# Patient Record
Sex: Female | Born: 1935 | Hispanic: Yes | Smoking: Never smoker
Health system: Southern US, Community
[De-identification: ages and names within clinical notes are randomized; demographics above are authoritative.]

## PROBLEM LIST (undated history)

## (undated) DIAGNOSIS — I219 Acute myocardial infarction, unspecified: Secondary | ICD-10-CM

## (undated) DIAGNOSIS — I10 Essential (primary) hypertension: Secondary | ICD-10-CM

## (undated) DIAGNOSIS — I2510 Atherosclerotic heart disease of native coronary artery without angina pectoris: Secondary | ICD-10-CM

## (undated) DIAGNOSIS — A159 Respiratory tuberculosis unspecified: Secondary | ICD-10-CM

## (undated) HISTORY — PX: ABDOMINAL HYSTERECTOMY: SHX81

---

## 2018-02-02 ENCOUNTER — Encounter: Payer: Self-pay | Admitting: *Deleted

## 2018-02-03 ENCOUNTER — Encounter: Payer: Self-pay | Admitting: *Deleted

## 2018-02-03 ENCOUNTER — Other Ambulatory Visit: Payer: Self-pay

## 2018-02-03 ENCOUNTER — Encounter
Admission: RE | Disposition: A | Discharge status: Home / Self Care | Payer: Self-pay | Source: Ambulatory Visit | Attending: Ophthalmology

## 2018-02-03 ENCOUNTER — Ambulatory Visit
Admission: RE | Admit: 2018-02-03 | Discharge: 2018-02-03 | Disposition: A | Discharge status: Home / Self Care | Payer: Medicaid Other | Source: Ambulatory Visit | Attending: Ophthalmology | Admitting: Ophthalmology

## 2018-02-03 ENCOUNTER — Ambulatory Visit: Payer: Medicaid Other | Admitting: Anesthesiology

## 2018-02-03 DIAGNOSIS — I10 Essential (primary) hypertension: Secondary | ICD-10-CM | POA: Diagnosis not present

## 2018-02-03 DIAGNOSIS — Z90710 Acquired absence of both cervix and uterus: Secondary | ICD-10-CM | POA: Insufficient documentation

## 2018-02-03 DIAGNOSIS — Z91013 Allergy to seafood: Secondary | ICD-10-CM | POA: Diagnosis not present

## 2018-02-03 DIAGNOSIS — H2512 Age-related nuclear cataract, left eye: Secondary | ICD-10-CM | POA: Insufficient documentation

## 2018-02-03 DIAGNOSIS — Z87891 Personal history of nicotine dependence: Secondary | ICD-10-CM | POA: Insufficient documentation

## 2018-02-03 DIAGNOSIS — Z8615 Personal history of latent tuberculosis infection: Secondary | ICD-10-CM | POA: Insufficient documentation

## 2018-02-03 DIAGNOSIS — I2510 Atherosclerotic heart disease of native coronary artery without angina pectoris: Secondary | ICD-10-CM | POA: Diagnosis not present

## 2018-02-03 DIAGNOSIS — I252 Old myocardial infarction: Secondary | ICD-10-CM | POA: Insufficient documentation

## 2018-02-03 HISTORY — DX: Respiratory tuberculosis unspecified: A15.9

## 2018-02-03 HISTORY — DX: Acute myocardial infarction, unspecified: I21.9

## 2018-02-03 HISTORY — DX: Essential (primary) hypertension: I10

## 2018-02-03 HISTORY — DX: Atherosclerotic heart disease of native coronary artery without angina pectoris: I25.10

## 2018-02-03 HISTORY — PX: CATARACT EXTRACTION W/PHACO: SHX586

## 2018-02-03 SURGERY — PHACOEMULSIFICATION, CATARACT, WITH IOL INSERTION
Anesthesia: Monitor Anesthesia Care | Site: Eye | Laterality: Left

## 2018-02-03 MED ORDER — CARBACHOL 0.01 % IO SOLN
INTRAOCULAR | Status: DC | PRN
  Administered 2018-02-03: .5 mL via INTRAOCULAR

## 2018-02-03 MED ORDER — EPINEPHRINE PF 1 MG/ML IJ SOLN
INTRAOCULAR | Status: DC | PRN
  Administered 2018-02-03: 1 mL via OPHTHALMIC

## 2018-02-03 MED ORDER — ARMC OPHTHALMIC DILATING DROPS
OPHTHALMIC | Status: AC
  Filled 2018-02-03: qty 0.5

## 2018-02-03 MED ORDER — NA CHONDROIT SULF-NA HYALURON 40-17 MG/ML IO SOLN
INTRAOCULAR | Status: DC | PRN
  Administered 2018-02-03: 1 mL via INTRAOCULAR

## 2018-02-03 MED ORDER — MOXIFLOXACIN HCL 0.5 % OP SOLN
OPHTHALMIC | Status: DC | PRN
  Administered 2018-02-03: .2 mL via OPHTHALMIC

## 2018-02-03 MED ORDER — MOXIFLOXACIN HCL 0.5 % OP SOLN
OPHTHALMIC | Status: AC
  Filled 2018-02-03: qty 3

## 2018-02-03 MED ORDER — SODIUM CHLORIDE 0.9 % IV SOLN
INTRAVENOUS | Status: DC
  Administered 2018-02-03: 11:00:00 via INTRAVENOUS

## 2018-02-03 MED ORDER — MOXIFLOXACIN HCL 0.5 % OP SOLN: 1.0000 [drp] | OPHTHALMIC | Status: DC | PRN

## 2018-02-03 MED ORDER — TETRACAINE HCL 0.5 % OP SOLN
OPHTHALMIC | Status: AC
  Filled 2018-02-03: qty 4

## 2018-02-03 MED ORDER — TETRACAINE HCL 0.5 % OP SOLN
1.0000 [drp] | OPHTHALMIC | Status: AC | PRN
  Administered 2018-02-03 (×3): 1 [drp] via OPHTHALMIC

## 2018-02-03 MED ORDER — LIDOCAINE HCL (PF) 4 % IJ SOLN
INTRAOCULAR | Status: DC | PRN
  Administered 2018-02-03: 2 mL via OPHTHALMIC

## 2018-02-03 MED ORDER — MIDAZOLAM HCL 2 MG/2ML IJ SOLN
INTRAMUSCULAR | Status: AC
  Filled 2018-02-03: qty 2

## 2018-02-03 MED ORDER — MIDAZOLAM HCL 2 MG/2ML IJ SOLN
INTRAMUSCULAR | Status: DC | PRN
  Administered 2018-02-03: 1 mg via INTRAVENOUS

## 2018-02-03 MED ORDER — POVIDONE-IODINE 5 % OP SOLN
OPHTHALMIC | Status: DC | PRN
  Administered 2018-02-03: 1 via OPHTHALMIC

## 2018-02-03 MED ORDER — ARMC OPHTHALMIC DILATING DROPS
1.0000 "application " | OPHTHALMIC | Status: AC
  Administered 2018-02-03 (×3): 1 via OPHTHALMIC

## 2018-02-03 SURGICAL SUPPLY — 16 items
GLOVE BIO SURGEON STRL SZ8 (GLOVE) ×3 IMPLANT
GLOVE BIOGEL M 6.5 STRL (GLOVE) ×3 IMPLANT
GLOVE SURG LX 8.0 MICRO (GLOVE) ×2
GLOVE SURG LX STRL 8.0 MICRO (GLOVE) ×1 IMPLANT
GOWN STRL REUS W/ TWL LRG LVL3 (GOWN DISPOSABLE) ×2 IMPLANT
GOWN STRL REUS W/TWL LRG LVL3 (GOWN DISPOSABLE) ×4
LABEL CATARACT MEDS ST (LABEL) ×3 IMPLANT
LENS IOL TECNIS ITEC 27.0 (Intraocular Lens) ×2 IMPLANT
PACK CATARACT (MISCELLANEOUS) ×3 IMPLANT
PACK CATARACT BRASINGTON LX (MISCELLANEOUS) ×3 IMPLANT
PACK EYE AFTER SURG (MISCELLANEOUS) ×3 IMPLANT
SOL BSS BAG (MISCELLANEOUS) ×3
SOLUTION BSS BAG (MISCELLANEOUS) ×1 IMPLANT
SYR 5ML LL (SYRINGE) ×3 IMPLANT
WATER STERILE IRR 250ML POUR (IV SOLUTION) ×3 IMPLANT
WIPE NON LINTING 3.25X3.25 (MISCELLANEOUS) ×3 IMPLANT

## 2018-02-03 NOTE — Transfer of Care (Signed)
Immediate Anesthesia Transfer of Care Note  Patient: Connie Burns  Procedure(s) Performed: CATARACT EXTRACTION PHACO AND INTRAOCULAR LENS PLACEMENT ([facility]) (Left Eye)  Patient Location: PACU and Short Stay  Anesthesia Type:MAC  Level of Consciousness: awake  Airway & Oxygen Therapy: Patient Spontanous Breathing  Post-op Assessment: Report given to RN  Post vital signs: stable  Last Vitals:  Vitals Value Taken Time  BP    Temp    Pulse    Resp    SpO2      Last Pain:  Vitals:   02/03/18 1033  TempSrc: Temporal  PainSc: 0-No pain         Complications: No apparent anesthesia complications

## 2018-02-03 NOTE — Anesthesia Postprocedure Evaluation (Signed)
Anesthesia Post Note  Patient: Connie Burns  Procedure(s) Performed: CATARACT EXTRACTION PHACO AND INTRAOCULAR LENS PLACEMENT ([place]) (Left Eye)  Patient location during evaluation: Short Stay Anesthesia Type: MAC Level of consciousness: awake and alert Pain management: pain level controlled Vital Signs Assessment: post-procedure vital signs reviewed and stable Respiratory status: spontaneous breathing and respiratory function stable Cardiovascular status: stable Postop Assessment: no apparent nausea or vomiting Anesthetic complications: no     Last Vitals:  Vitals:   02/03/18 1033  BP: (!) 144/75  Pulse: 90  Resp: 16  Temp: (!) 35.9 C  SpO2: 99%    Last Pain:  Vitals:   02/03/18 1033  TempSrc: Temporal  PainSc: 0-No pain                 Kera Deacon E Jillisa Harris

## 2018-02-03 NOTE — Anesthesia Preprocedure Evaluation (Signed)
Anesthesia Evaluation  Patient identified by MRN, date of birth, ID band Patient awake    Reviewed: Allergy & Precautions, NPO status , Patient's Chart, lab work & pertinent test results  History of Anesthesia Complications Negative for: history of anesthetic complications  Airway Mallampati: II  TM Distance: >3 FB Neck ROM: Full    Dental no notable dental hx.    Pulmonary neg sleep apnea, neg COPD, former smoker,    breath sounds clear to auscultation- rhonchi (-) wheezing      Cardiovascular Exercise Tolerance: Good hypertension, + CAD and + Past MI  (-) Cardiac Stents and (-) CABG  Rhythm:Regular Rate:Normal - Systolic murmurs and - Diastolic murmurs    Neuro/Psych negative neurological ROS  negative psych ROS   GI/Hepatic negative GI ROS, Neg liver ROS,   Endo/Other  negative endocrine ROSneg diabetes  Renal/GU negative Renal ROS     Musculoskeletal negative musculoskeletal ROS (+)   Abdominal (+) - obese,   Peds  Hematology negative hematology ROS (+)   Anesthesia Other Findings Past Medical History: No date: Coronary artery disease No date: Hypertension No date: Myocardial infarction [facility])     Comment:  2016 No date: Tuberculosis     Comment:  LATENT   TX 6 TO 8 MONTHS AGO   Reproductive/Obstetrics                             Anesthesia Physical Anesthesia Plan  ASA: II  Anesthesia Plan: MAC   Post-op Pain Management:    Induction: Intravenous  PONV Risk Score and Plan: 2 and Midazolam  Airway Management Planned: Natural Airway  Additional Equipment:   Intra-op Plan:   Post-operative Plan:   Informed Consent: I have reviewed the patients History and Physical, chart, labs and discussed the procedure including the risks, benefits and alternatives for the proposed anesthesia with the patient or authorized representative who has indicated his/her understanding and  acceptance.     Plan Discussed with: CRNA and Anesthesiologist  Anesthesia Plan Comments:         Anesthesia Quick Evaluation

## 2018-02-03 NOTE — Discharge Instructions (Signed)
Eye Surgery Discharge Instructions    Expect mild scratchy sensation or mild soreness. DO NOT RUB YOUR EYE!  The day of surgery:  Minimal physical activity, but bed rest is not required  No reading, computer work, or close hand work  No bending, lifting, or straining.  May watch TV  For 24 hours:  No driving, legal decisions, or alcoholic beverages  Safety precautions  Eat anything you prefer: It is better to start with liquids, then soup then solid foods.  _____ Eye patch should be worn until postoperative exam tomorrow.  ____ Solar shield eyeglasses should be worn for comfort in the sunlight/patch while sleeping  Resume all regular medications including aspirin or Coumadin if these were discontinued prior to surgery. You may shower, bathe, shave, or wash your hair. Tylenol may be taken for mild discomfort.  Call your doctor if you experience significant pain, nausea, or vomiting, fever > 101 or other signs of infection. [phone] or [phone] Specific instructions:  Follow-up Information    Galen Manila, MD Follow up.   Specialty:  Ophthalmology Why:  October 9 at 10:45am Contact information: [address] [place] [address] [phone]

## 2018-02-03 NOTE — Anesthesia Post-op Follow-up Note (Signed)
Anesthesia QCDR form completed.        

## 2018-02-03 NOTE — Op Note (Signed)
PREOPERATIVE DIAGNOSIS:  Nuclear sclerotic cataract of the left eye.   POSTOPERATIVE DIAGNOSIS:  Nuclear sclerotic cataract of the left eye.   OPERATIVE PROCEDURE: Procedure(s): CATARACT EXTRACTION PHACO AND INTRAOCULAR LENS PLACEMENT ([facility])   SURGEON:  Galen Manila, MD.   ANESTHESIA:  Anesthesiologist: Alver Fisher, MD CRNA: Oliva Bustard, CRNA  1.      Managed anesthesia care. 2.     0.31ml of Shugarcaine was instilled following the paracentesis   COMPLICATIONS:  None.   TECHNIQUE:   Stop and chop   DESCRIPTION OF PROCEDURE:  The patient was examined and consented in the preoperative holding area where the aforementioned topical anesthesia was applied to the left eye and then brought back to the Operating Room where the left eye was prepped and draped in the usual sterile ophthalmic fashion and a lid speculum was placed. A paracentesis was created with the side port blade and the anterior chamber was filled with viscoelastic. A near clear corneal incision was performed with the steel keratome. A continuous curvilinear capsulorrhexis was performed with a cystotome followed by the capsulorrhexis forceps. Hydrodissection and hydrodelineation were carried out with BSS on a blunt cannula. The lens was removed in a stop and chop  technique and the remaining cortical material was removed with the irrigation-aspiration handpiece. The capsular bag was inflated with viscoelastic and the Technis [id number] lens was placed in the capsular bag without complication. The remaining viscoelastic was removed from the eye with the irrigation-aspiration handpiece. The wounds were hydrated. The anterior chamber was flushed with Miostat and the eye was inflated to physiologic pressure. 0.10ml Vigamox was placed in the anterior chamber. The wounds were found to be water tight. The eye was dressed with Vigamox. The patient was given protective glasses to wear throughout the day and a shield with which to sleep  tonight. The patient was also given drops with which to begin a drop regimen today and will follow-up with me in one day. Implant Name Type Inv. Item Serial No. Manufacturer Lot No. LRB No. Used  LENS IOL DIOP 27.0 - [id number] Intraocular Lens LENS IOL DIOP 27.0 [id number]  Left 1    Procedure(s) with comments: CATARACT EXTRACTION PHACO AND INTRAOCULAR LENS PLACEMENT ([facility]) (Left) - [place] 00:36 CDE Fluid pack lot #[id number]  Electronically signed: Galen Manila 02/03/2018 12:10 PM

## 2018-02-03 NOTE — H&P (Signed)
All labs reviewed. Abnormal studies sent to patients PCP when indicated.  Previous H&P reviewed, patient examined, there are NO CHANGES.  Connie Wenke Porfilio10/8/201911:42 AM

## 2018-02-04 ENCOUNTER — Encounter: Payer: Self-pay | Admitting: Ophthalmology

## 2018-03-02 ENCOUNTER — Encounter: Payer: Self-pay | Admitting: *Deleted

## 2018-03-03 ENCOUNTER — Ambulatory Visit: Payer: Medicaid Other | Admitting: Anesthesiology

## 2018-03-03 ENCOUNTER — Ambulatory Visit
Admission: RE | Admit: 2018-03-03 | Discharge: 2018-03-03 | Disposition: A | Discharge status: Home / Self Care | Payer: Medicaid Other | Source: Ambulatory Visit | Attending: Ophthalmology | Admitting: Ophthalmology

## 2018-03-03 ENCOUNTER — Other Ambulatory Visit: Payer: Self-pay

## 2018-03-03 ENCOUNTER — Encounter
Admission: RE | Disposition: A | Discharge status: Home / Self Care | Payer: Self-pay | Source: Ambulatory Visit | Attending: Ophthalmology

## 2018-03-03 DIAGNOSIS — Z7982 Long term (current) use of aspirin: Secondary | ICD-10-CM | POA: Diagnosis not present

## 2018-03-03 DIAGNOSIS — I252 Old myocardial infarction: Secondary | ICD-10-CM | POA: Diagnosis not present

## 2018-03-03 DIAGNOSIS — Z79899 Other long term (current) drug therapy: Secondary | ICD-10-CM | POA: Diagnosis not present

## 2018-03-03 DIAGNOSIS — H2511 Age-related nuclear cataract, right eye: Secondary | ICD-10-CM | POA: Insufficient documentation

## 2018-03-03 DIAGNOSIS — I10 Essential (primary) hypertension: Secondary | ICD-10-CM | POA: Diagnosis not present

## 2018-03-03 HISTORY — PX: CATARACT EXTRACTION W/PHACO: SHX586

## 2018-03-03 SURGERY — PHACOEMULSIFICATION, CATARACT, WITH IOL INSERTION
Anesthesia: Monitor Anesthesia Care | Site: Eye | Laterality: Right

## 2018-03-03 MED ORDER — LIDOCAINE HCL (PF) 4 % IJ SOLN
INTRAOCULAR | Status: DC | PRN
  Administered 2018-03-03: 2 mL via OPHTHALMIC

## 2018-03-03 MED ORDER — DIPHENHYDRAMINE HCL 50 MG/ML IJ SOLN
INTRAMUSCULAR | Status: DC | PRN
  Administered 2018-03-03: 12.5 mg via INTRAVENOUS

## 2018-03-03 MED ORDER — TETRACAINE HCL 0.5 % OP SOLN
1.0000 [drp] | OPHTHALMIC | Status: DC | PRN
  Administered 2018-03-03 (×3): 1 [drp] via OPHTHALMIC

## 2018-03-03 MED ORDER — MIDAZOLAM HCL 2 MG/2ML IJ SOLN
INTRAMUSCULAR | Status: DC | PRN
  Administered 2018-03-03: 1 mg via INTRAVENOUS

## 2018-03-03 MED ORDER — CARBACHOL 0.01 % IO SOLN
INTRAOCULAR | Status: DC | PRN
  Administered 2018-03-03: .5 mL via INTRAOCULAR

## 2018-03-03 MED ORDER — ARMC OPHTHALMIC DILATING DROPS
1.0000 "application " | OPHTHALMIC | Status: AC
  Administered 2018-03-03 (×3): 1 via OPHTHALMIC

## 2018-03-03 MED ORDER — TETRACAINE HCL 0.5 % OP SOLN
OPHTHALMIC | Status: AC
  Administered 2018-03-03: 1 [drp] via OPHTHALMIC
  Filled 2018-03-03: qty 4

## 2018-03-03 MED ORDER — SODIUM CHLORIDE 0.9 % IV SOLN
INTRAVENOUS | Status: DC
  Administered 2018-03-03: 09:00:00 via INTRAVENOUS

## 2018-03-03 MED ORDER — DIPHENHYDRAMINE HCL 50 MG/ML IJ SOLN
INTRAMUSCULAR | Status: AC
  Filled 2018-03-03: qty 1

## 2018-03-03 MED ORDER — NA CHONDROIT SULF-NA HYALURON 40-17 MG/ML IO SOLN
INTRAOCULAR | Status: DC | PRN
  Administered 2018-03-03: 1 mL via INTRAOCULAR

## 2018-03-03 MED ORDER — MIDAZOLAM HCL 2 MG/2ML IJ SOLN
INTRAMUSCULAR | Status: AC
  Filled 2018-03-03: qty 2

## 2018-03-03 MED ORDER — ARMC OPHTHALMIC DILATING DROPS
OPHTHALMIC | Status: AC
  Administered 2018-03-03: 1 via OPHTHALMIC
  Filled 2018-03-03: qty 0.5

## 2018-03-03 MED ORDER — POVIDONE-IODINE 5 % OP SOLN
OPHTHALMIC | Status: DC | PRN
  Administered 2018-03-03: 1 via OPHTHALMIC

## 2018-03-03 MED ORDER — EPINEPHRINE PF 1 MG/ML IJ SOLN
INTRAOCULAR | Status: DC | PRN
  Administered 2018-03-03: 1 mL via OPHTHALMIC

## 2018-03-03 MED ORDER — MOXIFLOXACIN HCL 0.5 % OP SOLN: 1.0000 [drp] | OPHTHALMIC | Status: DC | PRN

## 2018-03-03 MED ORDER — MOXIFLOXACIN HCL 0.5 % OP SOLN
OPHTHALMIC | Status: DC | PRN
  Administered 2018-03-03: .2 mL via OPHTHALMIC

## 2018-03-03 MED ORDER — MOXIFLOXACIN HCL 0.5 % OP SOLN
OPHTHALMIC | Status: AC
  Filled 2018-03-03: qty 3

## 2018-03-03 SURGICAL SUPPLY — 16 items
GLOVE BIO SURGEON STRL SZ8 (GLOVE) ×3 IMPLANT
GLOVE BIOGEL M 6.5 STRL (GLOVE) ×3 IMPLANT
GLOVE SURG LX 8.0 MICRO (GLOVE) ×2
GLOVE SURG LX STRL 8.0 MICRO (GLOVE) ×1 IMPLANT
GOWN STRL REUS W/ TWL LRG LVL3 (GOWN DISPOSABLE) ×2 IMPLANT
GOWN STRL REUS W/TWL LRG LVL3 (GOWN DISPOSABLE) ×4
LABEL CATARACT MEDS ST (LABEL) ×3 IMPLANT
LENS IOL TECNIS ITEC 27.0 (Intraocular Lens) ×2 IMPLANT
PACK CATARACT (MISCELLANEOUS) ×3 IMPLANT
PACK CATARACT BRASINGTON LX (MISCELLANEOUS) ×3 IMPLANT
PACK EYE AFTER SURG (MISCELLANEOUS) ×3 IMPLANT
SOL BSS BAG (MISCELLANEOUS) ×3
SOLUTION BSS BAG (MISCELLANEOUS) ×1 IMPLANT
SYR 5ML LL (SYRINGE) ×3 IMPLANT
WATER STERILE IRR 250ML POUR (IV SOLUTION) ×3 IMPLANT
WIPE NON LINTING 3.25X3.25 (MISCELLANEOUS) ×3 IMPLANT

## 2018-03-03 NOTE — Anesthesia Post-op Follow-up Note (Signed)
Anesthesia QCDR form completed.        

## 2018-03-03 NOTE — Anesthesia Preprocedure Evaluation (Signed)
Anesthesia Evaluation  Patient identified by MRN, date of birth, ID band Patient awake    Reviewed: Allergy & Precautions, NPO status , Patient's Chart, lab work & pertinent test results  History of Anesthesia Complications Negative for: history of anesthetic complications  Airway Mallampati: II  TM Distance: >3 FB Neck ROM: Full    Dental no notable dental hx.    Pulmonary neg sleep apnea, neg COPD, former smoker,    breath sounds clear to auscultation- rhonchi (-) wheezing      Cardiovascular Exercise Tolerance: Good hypertension, + CAD and + Past MI  (-) Cardiac Stents and (-) CABG  Rhythm:Regular Rate:Normal - Systolic murmurs and - Diastolic murmurs    Neuro/Psych negative neurological ROS  negative psych ROS   GI/Hepatic negative GI ROS, Neg liver ROS,   Endo/Other  negative endocrine ROSneg diabetes  Renal/GU negative Renal ROS     Musculoskeletal negative musculoskeletal ROS (+)   Abdominal (+) - obese,   Peds  Hematology negative hematology ROS (+)   Anesthesia Other Findings Past Medical History: No date: Coronary artery disease No date: Hypertension No date: Myocardial infarction ([facility])     Comment:  2016 No date: Tuberculosis     Comment:  LATENT   TX 6 TO 8 MONTHS AGO   Reproductive/Obstetrics                             Anesthesia Physical Anesthesia Plan  ASA: II  Anesthesia Plan: MAC   Post-op Pain Management:    Induction: Intravenous  PONV Risk Score and Plan: 2 and Midazolam  Airway Management Planned: Natural Airway  Additional Equipment:   Intra-op Plan:   Post-operative Plan:   Informed Consent: I have reviewed the patients History and Physical, chart, labs and discussed the procedure including the risks, benefits and alternatives for the proposed anesthesia with the patient or authorized representative who has indicated his/her understanding and  acceptance.     Plan Discussed with: CRNA and Anesthesiologist  Anesthesia Plan Comments:         Anesthesia Quick Evaluation  

## 2018-03-03 NOTE — H&P (Signed)
All labs reviewed. Abnormal studies sent to patients PCP when indicated.  Previous H&P reviewed, patient examined, there are NO CHANGES.  Connie Pink Porfilio11/5/20199:08 AM

## 2018-03-03 NOTE — Transfer of Care (Signed)
Immediate Anesthesia Transfer of Care Note  Patient: Connie Burns  Procedure(s) Performed: CATARACT EXTRACTION PHACO AND INTRAOCULAR LENS PLACEMENT ([place]) (Right Eye)  Patient Location: Short Stay  Anesthesia Type:MAC  Level of Consciousness: awake, alert  and oriented  Airway & Oxygen Therapy: Patient Spontanous Breathing  Post-op Assessment: Report given to RN and Post -op Vital signs reviewed and stable  Post vital signs: Reviewed and stable  Last Vitals:  Vitals Value Taken Time  BP 146/67 03/03/2018  9:37 AM  Temp 36.6 C 03/03/2018  9:37 AM  Pulse 82 03/03/2018  9:37 AM  Resp 16 03/03/2018  9:37 AM  SpO2 100 % 03/03/2018  9:37 AM    Last Pain:  Vitals:   03/03/18 0937  TempSrc: Temporal  PainSc: 7          Complications: No apparent anesthesia complications

## 2018-03-03 NOTE — Discharge Instructions (Addendum)
°  Eye Surgery Discharge Instructions  Expect mild scratchy sensation or mild soreness. DO NOT RUB YOUR EYE!  The day of surgery:  Minimal physical activity, but bed rest is not required  No reading, computer work, or close hand work  No bending, lifting, or straining.  May watch TV  For 24 hours:  No driving, legal decisions, or alcoholic beverages  Safety precautions  Eat anything you prefer: It is better to start with liquids, then soup then solid foods.  Solar shield eyeglasses should be worn for comfort in the sunlight/patch while sleeping  Resume all regular medications including aspirin or Coumadin if these were discontinued prior to surgery. You may shower, bathe, shave, or wash your hair. Tylenol may be taken for mild discomfort. Follow Dr. Gerome Sam eye drop instruction sheet as reviewed.  Call your doctor if you experience significant pain, nausea, or vomiting, fever > 101 or other signs of infection. [phone] or [phone] Specific instructions:  Follow-up Information    Galen Manila, MD Follow up.   Specialty:  Ophthalmology Why:  111/6/19 @ 9:40 am Contact information: [address] [place] [address] [phone]

## 2018-03-03 NOTE — Anesthesia Postprocedure Evaluation (Signed)
Anesthesia Post Note  Patient: Connie Burns  Procedure(s) Performed: CATARACT EXTRACTION PHACO AND INTRAOCULAR LENS PLACEMENT ([place]) (Right Eye)  Patient location during evaluation: Short Stay Anesthesia Type: MAC Level of consciousness: awake, awake and alert and oriented Pain management: pain level controlled Vital Signs Assessment: post-procedure vital signs reviewed and stable Respiratory status: spontaneous breathing Cardiovascular status: stable Postop Assessment: no headache and adequate PO intake Anesthetic complications: no     Last Vitals:  Vitals:   03/03/18 0833 03/03/18 0937  BP: (!) 157/91 (!) 146/67  Pulse: 97 82  Resp: 16 16  Temp: 36.5 C 36.6 C  SpO2: 100% 100%    Last Pain:  Vitals:   03/03/18 0937  TempSrc: Temporal  PainSc: 7                  Lanora Manis

## 2018-03-03 NOTE — Op Note (Signed)
PREOPERATIVE DIAGNOSIS:  Nuclear sclerotic cataract of the right eye.   POSTOPERATIVE DIAGNOSIS:  nuclear sclerotic cataract right eye   OPERATIVE PROCEDURE: Procedure(s): CATARACT EXTRACTION PHACO AND INTRAOCULAR LENS PLACEMENT ([facility])   SURGEON:  Galen Manila, MD.   ANESTHESIA:  Anesthesiologist: Alver Fisher, MD CRNA: Omer Jack, CRNA  1.      Managed anesthesia care. 2.      0.77ml of Shugarcaine was instilled in the eye following the paracentesis.   COMPLICATIONS:  None.   TECHNIQUE:   Stop and chop   DESCRIPTION OF PROCEDURE:  The patient was examined and consented in the preoperative holding area where the aforementioned topical anesthesia was applied to the right eye and then brought back to the Operating Room where the right eye was prepped and draped in the usual sterile ophthalmic fashion and a lid speculum was placed. A paracentesis was created with the side port blade and the anterior chamber was filled with viscoelastic. A near clear corneal incision was performed with the steel keratome. A continuous curvilinear capsulorrhexis was performed with a cystotome followed by the capsulorrhexis forceps. Hydrodissection and hydrodelineation were carried out with BSS on a blunt cannula. The lens was removed in a stop and chop  technique and the remaining cortical material was removed with the irrigation-aspiration handpiece. The capsular bag was inflated with viscoelastic and the Technis ZCB00  lens was placed in the capsular bag without complication. The remaining viscoelastic was removed from the eye with the irrigation-aspiration handpiece. The wounds were hydrated. The anterior chamber was flushed with Miostat and the eye was inflated to physiologic pressure. 0.46ml of Vigamox was placed in the anterior chamber. The wounds were found to be water tight. The eye was dressed with Vigamox. The patient was given protective glasses to wear throughout the day and a shield with which  to sleep tonight. The patient was also given drops with which to begin a drop regimen today and will follow-up with me in one day. Implant Name Type Inv. Item Serial No. Manufacturer Lot No. LRB No. Used  LENS IOL DIOP 27.0 - [id number] Intraocular Lens LENS IOL DIOP 27.0 [id number]  Right 1   Procedure(s) with comments: CATARACT EXTRACTION PHACO AND INTRAOCULAR LENS PLACEMENT ([facility]) (Right) - [place]  00:44  CDE 7.04 Fluid pack lot # [id number]  Electronically signed: Galen Manila 03/03/2018 9:36 AM

## 2018-03-03 NOTE — OR Nursing (Signed)
Connie Burns present for discharge instructions

## 2018-12-23 ENCOUNTER — Other Ambulatory Visit: Payer: Self-pay | Admitting: Physician Assistant

## 2018-12-23 DIAGNOSIS — Z0000 Encounter for general adult medical examination without abnormal findings: Secondary | ICD-10-CM

## 2019-01-06 ENCOUNTER — Other Ambulatory Visit: Payer: Self-pay | Admitting: Physician Assistant

## 2019-01-06 DIAGNOSIS — Z1231 Encounter for screening mammogram for malignant neoplasm of breast: Secondary | ICD-10-CM

## 2019-01-20 ENCOUNTER — Ambulatory Visit
Admission: RE | Admit: 2019-01-20 | Discharge: 2019-01-20 | Disposition: A | Discharge status: Home / Self Care | Payer: Medicaid Other | Source: Ambulatory Visit | Attending: Physician Assistant | Admitting: Physician Assistant

## 2019-01-20 ENCOUNTER — Other Ambulatory Visit: Payer: Self-pay

## 2019-01-20 ENCOUNTER — Other Ambulatory Visit: Payer: Self-pay | Admitting: Physician Assistant

## 2019-01-20 DIAGNOSIS — R2242 Localized swelling, mass and lump, left lower limb: Secondary | ICD-10-CM | POA: Diagnosis present

## 2019-02-15 ENCOUNTER — Other Ambulatory Visit: Payer: Medicaid Other

## 2019-02-18 ENCOUNTER — Ambulatory Visit
Admission: RE | Admit: 2019-02-18 | Discharge: 2019-02-18 | Disposition: A | Discharge status: Home / Self Care | Payer: Medicaid Other | Source: Ambulatory Visit | Attending: Physician Assistant | Admitting: Physician Assistant

## 2019-02-18 DIAGNOSIS — Z0000 Encounter for general adult medical examination without abnormal findings: Secondary | ICD-10-CM | POA: Insufficient documentation

## 2019-02-18 DIAGNOSIS — Z1231 Encounter for screening mammogram for malignant neoplasm of breast: Secondary | ICD-10-CM | POA: Diagnosis present

## 2019-05-06 ENCOUNTER — Ambulatory Visit: Payer: Medicaid Other | Attending: Internal Medicine

## 2019-05-06 DIAGNOSIS — Z20822 Contact with and (suspected) exposure to covid-19: Secondary | ICD-10-CM

## 2019-05-08 LAB — NOVEL CORONAVIRUS, NAA: SARS-CoV-2, NAA: DETECTED — AB

## 2020-01-18 ENCOUNTER — Other Ambulatory Visit: Payer: Self-pay | Admitting: Physician Assistant

## 2020-01-18 DIAGNOSIS — Z1231 Encounter for screening mammogram for malignant neoplasm of breast: Secondary | ICD-10-CM

## 2020-02-21 ENCOUNTER — Other Ambulatory Visit: Payer: Self-pay

## 2020-02-21 ENCOUNTER — Ambulatory Visit
Admission: RE | Admit: 2020-02-21 | Discharge: 2020-02-21 | Disposition: A | Discharge status: Home / Self Care | Payer: Medicaid Other | Source: Ambulatory Visit | Attending: Physician Assistant | Admitting: Physician Assistant

## 2020-02-21 DIAGNOSIS — Z1231 Encounter for screening mammogram for malignant neoplasm of breast: Secondary | ICD-10-CM | POA: Diagnosis not present

## 2021-01-22 ENCOUNTER — Other Ambulatory Visit: Payer: Self-pay | Admitting: Registered Nurse

## 2021-01-22 ENCOUNTER — Other Ambulatory Visit: Payer: Self-pay | Admitting: Physician Assistant

## 2021-01-22 DIAGNOSIS — Z1231 Encounter for screening mammogram for malignant neoplasm of breast: Secondary | ICD-10-CM

## 2021-02-02 ENCOUNTER — Ambulatory Visit
Admission: RE | Admit: 2021-02-02 | Discharge: 2021-02-02 | Disposition: A | Discharge status: Home / Self Care | Payer: Medicaid Other | Source: Ambulatory Visit | Attending: Physician Assistant | Admitting: Physician Assistant

## 2021-02-02 ENCOUNTER — Other Ambulatory Visit: Payer: Self-pay

## 2021-02-02 DIAGNOSIS — Z1231 Encounter for screening mammogram for malignant neoplasm of breast: Secondary | ICD-10-CM | POA: Insufficient documentation

## 2021-04-11 ENCOUNTER — Emergency Department
Admission: EM | Admit: 2021-04-11 | Discharge: 2021-04-11 | Disposition: A | Discharge status: Home / Self Care | Payer: Medicaid Other | Attending: Emergency Medicine | Admitting: Emergency Medicine

## 2021-04-11 ENCOUNTER — Emergency Department: Payer: Medicaid Other

## 2021-04-11 ENCOUNTER — Other Ambulatory Visit: Payer: Self-pay

## 2021-04-11 DIAGNOSIS — I10 Essential (primary) hypertension: Secondary | ICD-10-CM | POA: Diagnosis not present

## 2021-04-11 DIAGNOSIS — M79604 Pain in right leg: Secondary | ICD-10-CM

## 2021-04-11 LAB — CBC WITH DIFFERENTIAL/PLATELET
Abs Immature Granulocytes: 0.04 10*3/uL (ref 0.00–0.07)
Basophils Absolute: 0 10*3/uL (ref 0.0–0.1)
Basophils Relative: 0 %
Eosinophils Absolute: 0 10*3/uL (ref 0.0–0.5)
Eosinophils Relative: 0 %
HCT: 43.7 % (ref 36.0–46.0)
Hemoglobin: 14.2 g/dL (ref 12.0–15.0)
Immature Granulocytes: 1 %
Lymphocytes Relative: 24 %
Lymphs Abs: 2 10*3/uL (ref 0.7–4.0)
MCH: 30.1 pg (ref 26.0–34.0)
MCHC: 32.5 g/dL (ref 30.0–36.0)
MCV: 92.6 fL (ref 80.0–100.0)
Monocytes Absolute: 0.3 10*3/uL (ref 0.1–1.0)
Monocytes Relative: 4 %
Neutro Abs: 6.1 10*3/uL (ref 1.7–7.7)
Neutrophils Relative %: 71 %
Platelets: 266 10*3/uL (ref 150–400)
RBC: 4.72 MIL/uL (ref 3.87–5.11)
RDW: 13.2 % (ref 11.5–15.5)
WBC: 8.6 10*3/uL (ref 4.0–10.5)
nRBC: 0 % (ref 0.0–0.2)

## 2021-04-11 LAB — BASIC METABOLIC PANEL
Anion gap: 8 (ref 5–15)
BUN: 16 mg/dL (ref 8–23)
CO2: 28 mmol/L (ref 22–32)
Calcium: 9.8 mg/dL (ref 8.9–10.3)
Chloride: 104 mmol/L (ref 98–111)
Creatinine, Ser: 0.75 mg/dL (ref 0.44–1.00)
GFR, Estimated: >60 mL/min (ref >60)
Glucose, Bld: 127 mg/dL — ABNORMAL HIGH (ref 70–99)
Potassium: 4.5 mmol/L (ref 3.5–5.1)
Sodium: 140 mmol/L (ref 135–145)

## 2021-04-11 LAB — CK: Total CK: 83 U/L (ref 38–234)

## 2021-04-11 MED ORDER — NAPROXEN 500 MG PO TABS
500.0000 mg | ORAL_TABLET | Freq: Once | ORAL | Status: AC
  Administered 2021-04-11: 12:00:00 500 mg via ORAL
  Filled 2021-04-11: qty 1

## 2021-04-11 NOTE — ED Notes (Signed)
Purewick placed at this time.

## 2021-04-11 NOTE — ED Notes (Addendum)
Contacted Vonna Kotyk [phone]) , pt's son, who will arrive in approx. 10 mins to pick pt. Up .

## 2021-04-11 NOTE — ED Provider Notes (Signed)
[facility] Emergency Department Provider Note  ____________________________________________   Event Date/Time   First MD Initiated Contact with Patient 04/11/21 1026     (approximate)  I have reviewed the triage vital signs and the nursing notes.   HISTORY  Chief Complaint Leg Pain   HPI Connie Burns is a 85 y.o. female past medical history of hypertension who presents for assessment approximately 2 weeks of nontraumatic pain in right leg.  Patient states she was seen last week and prescribed a cream which she does not feel is helped much.  Patient adamantly denies any preceding injuries or falls or pain anywhere else including in the left lower extremity, arms, back, abdomen, chest, head, ears, nose, throat or elsewhere.  She has not had any rashes or swelling.  No prior similar episodes.  No history of DVT.  No recent surgeries.  No history of CHF.  No fevers, chills, or other constitutional symptoms.  No other acute concerns at this time.  She has been able to ambulate on it.         Past Medical History:  Diagnosis Date   Hypertension     There are no problems to display for this patient.     Prior to Admission medications   Not on File    Allergies Patient has no known allergies.  History reviewed. No pertinent family history.  Social History Social History   Tobacco Use   Smoking status: Never    Passive exposure: Never   Smokeless tobacco: Never  Substance Use Topics   Alcohol use: Never   Drug use: Never    Review of Systems  Review of Systems  Constitutional:  Negative for chills and fever.  HENT:  Negative for sore throat.   Eyes:  Negative for pain.  Respiratory:  Negative for cough and stridor.   Cardiovascular:  Negative for chest pain.  Gastrointestinal:  Negative for vomiting.  Genitourinary:  Negative for dysuria.  Musculoskeletal:  Positive for myalgias (R leg).  Skin:  Negative for rash.   Neurological:  Negative for seizures, loss of consciousness and headaches.  Psychiatric/Behavioral:  Negative for suicidal ideas.   All other systems reviewed and are negative.    ____________________________________________   PHYSICAL EXAM:  VITAL SIGNS: ED Triage Vitals  Enc Vitals Group     BP      Pulse      Resp      Temp      Temp src      SpO2      Weight      Height      Head Circumference      Peak Flow      Pain Score      Pain Loc      Pain Edu?      Excl. in [place]?    Vitals:   04/11/21 1031  BP: (!) 168/91  Pulse: (!) 108  Resp: 16  Temp: 98.5 F (36.9 C)  SpO2: 99%   Physical Exam Vitals and nursing note reviewed.  Constitutional:      General: She is not in acute distress.    Appearance: She is well-developed.  HENT:     Head: Normocephalic and atraumatic.     Right Ear: External ear normal.     Left Ear: External ear normal.     Nose: Nose normal.  Eyes:     Conjunctiva/sclera: Conjunctivae normal.  Cardiovascular:     Rate and  Rhythm: Normal rate and regular rhythm.     Heart sounds: No murmur heard. Pulmonary:     Effort: Pulmonary effort is normal. No respiratory distress.     Breath sounds: Normal breath sounds.  Abdominal:     Palpations: Abdomen is soft.     Tenderness: There is no abdominal tenderness.  Musculoskeletal:        General: No swelling.     Cervical back: Neck supple.  Skin:    General: Skin is warm and dry.     Capillary Refill: Capillary refill takes less than 2 seconds.  Neurological:     Mental Status: She is alert and oriented to person, place, and time.  Psychiatric:        Mood and Affect: Mood normal.    2+ bilateral DP pulses.  There is no significant lower extremity edema or effusion swelling or other overlying skin changes about the right lower extremity specifically around the ankle or knee.  No areas of point tenderness or induration.  Sensation is intact light touch throughout the bilateral lower  extremities. ____________________________________________   LABS (all labs ordered are listed, but only abnormal results are displayed)  Labs Reviewed  BASIC METABOLIC PANEL - Abnormal; Notable for the following components:      Result Value   Glucose, Bld 127 (*)    All other components within normal limits  CBC WITH DIFFERENTIAL/PLATELET  CK   ____________________________________________  EKG  ____________________________________________  RADIOLOGY  ED MD interpretation: Right lower extremity ultrasound shows no evidence of DVT, abscess, cyst or other acute process.  Official radiology report(s): US Venous Img Lower Unilateral Right  Result Date: 04/11/2021 CLINICAL DATA:  Right leg pain EXAM: RIGHT LOWER EXTREMITY VENOUS DOPPLER ULTRASOUND TECHNIQUE: Gray-scale sonography with compression, as well as color and duplex ultrasound, were performed to evaluate the deep venous system(s) from the level of the common femoral vein through the popliteal and proximal calf veins. COMPARISON:  None. FINDINGS: VENOUS Normal compressibility of the common femoral, superficial femoral, and popliteal veins, as well as the visualized calf veins. Visualized portions of profunda femoral vein and great saphenous vein unremarkable. No filling defects to suggest DVT on grayscale or color Doppler imaging. Doppler waveforms show normal direction of venous flow, normal respiratory plasticity and response to augmentation. Limited views of the contralateral common femoral vein are unremarkable. OTHER None. Limitations: none IMPRESSION: Negative. Electronically Signed   By: Acquanetta Belling M.D.   On: 04/11/2021 12:04    ____________________________________________   PROCEDURES  Procedure(s) performed (including Critical Care):  Procedures   ____________________________________________   INITIAL IMPRESSION / ASSESSMENT AND PLAN / ED COURSE      Patient presents with above-stated history exam for  assessment approximately 2 weeks of some nontraumatic pain in the right calf.  She denies any other associated symptoms and has been able to bear weight albeit with some difficulty.  She has taken some Tylenol intermittently but does not feel is helped much.  She has not yet seen her PCP after initially being seen in the ED a week ago.  On arrival today she is hypertensive and tachycardic with otherwise stable vital signs on room air.  Is possible this hypertension is related to pain although she does have underlying high blood pressure and I recommend she have this rechecked by her PCP.  Primary differential considerations include a Baker's cyst, cellulitis, DVT, myositis and muscle spasm.  Right lower extremity ultrasound shows no evidence of DVT, abscess, cyst or  other acute process.  No evidence of a cellulitis or septic joint at the right knee or ankle on exam.  CK not consistent with myositis.  BMP without significant electrolyte or metabolic derangements.  CBC is unremarkable.  At this time given low suspicion for immediately life or limb threatening process and patient able to bear weight I think she is stable for discharge with close outpatient PCP follow-up.  Discharged stable condition.  Strict return precautions advised and discussed.       ____________________________________________   FINAL CLINICAL IMPRESSION(S) / ED DIAGNOSES  Final diagnoses:  Right leg pain  Hypertension, unspecified type    Medications  naproxen (NAPROSYN) tablet 500 mg (500 mg Oral Given 04/11/21 1217)     ED Discharge Orders     None        Note:  This document was prepared using Dragon voice recognition software and may include unintentional dictation errors.    Lucrezia Starch, MD 04/11/21 [phone]

## 2021-04-11 NOTE — ED Triage Notes (Signed)
Pt to ED for R lower leg pain from knee to foot. Mostly complains of R calf pain. Onset was about 1 week ago. Was seen by ED provider at [facility] and prescribed muscle relaxer, which she has been taking. Pain has not subsided.  R calf does not appear red, swollen or tender to touch.  Pt arrived from home via [facility]. Rates pain as 7/10, describes as pressure and tightness in calf and describes difficulty ambulating from pain.

## 2021-04-11 NOTE — Discharge Instructions (Signed)
You may take 1 g of Tylenol every 6 hours and 500 mg of naproxen every 12 hours as needed for pain in your right calf until he can get seen by your primary care doctor.  Please return immediately for any new or worsening of symptoms.

## 2021-05-08 ENCOUNTER — Ambulatory Visit: Payer: Medicaid Other | Attending: Physician Assistant

## 2021-05-08 ENCOUNTER — Other Ambulatory Visit: Payer: Self-pay

## 2021-05-08 VITALS — BP 168/82 | HR 79 | Ht 60.0 in | Wt 120.0 lb

## 2021-05-08 DIAGNOSIS — M79604 Pain in right leg: Secondary | ICD-10-CM | POA: Insufficient documentation

## 2021-05-08 DIAGNOSIS — R2681 Unsteadiness on feet: Secondary | ICD-10-CM | POA: Insufficient documentation

## 2021-05-08 DIAGNOSIS — R269 Unspecified abnormalities of gait and mobility: Secondary | ICD-10-CM | POA: Insufficient documentation

## 2021-05-08 DIAGNOSIS — M6281 Muscle weakness (generalized): Secondary | ICD-10-CM | POA: Insufficient documentation

## 2021-05-08 DIAGNOSIS — R262 Difficulty in walking, not elsewhere classified: Secondary | ICD-10-CM | POA: Diagnosis present

## 2021-05-08 NOTE — Therapy (Addendum)
[place] [facility] [address] [place], [place], [address] Phone: [phone]   Fax:  [phone]  Physical Therapy Evaluation  Patient Details  Name: Connie Burns MRN: [id number] Date of Birth: 07/29/1935 Referring Provider (PT): Thomasenia BottomsElizabeth Rasmussen, [place]   Encounter Date: 05/08/2021     Past Medical History:  Diagnosis Date   Coronary artery disease    Hypertension    Myocardial infarction (HCC)    2016   Tuberculosis    LATENT   TX 6 TO 8 MONTHS AGO    Past Surgical History:  Procedure Laterality Date   ABDOMINAL HYSTERECTOMY     CATARACT EXTRACTION W/PHACO Left 02/03/2018   Procedure: CATARACT EXTRACTION PHACO AND INTRAOCULAR LENS PLACEMENT (IOC);  Surgeon: Galen ManilaPorfilio, William, MD;  Location: [facility] ORS;  Service: Ophthalmology;  Laterality: Left;  US 00:36 CDE Fluid pack lot #[id number]   CATARACT EXTRACTION W/PHACO Right 03/03/2018   Procedure: CATARACT EXTRACTION PHACO AND INTRAOCULAR LENS PLACEMENT (IOC);  Surgeon: Galen ManilaPorfilio, William, MD;  Location: [facility] ORS;  Service: Ophthalmology;  Laterality: Right;  US  00:44  CDE 7.04 Fluid pack lot # [id number]    Vitals:   05/08/21 1634  BP: (!) 168/82  Pulse: 79  Weight: 120 lb (54.4 kg)  Height: 5' (1.524 m)          OBJECTIVE  MUSCULOSKELETAL: Tremor: Absent Bulk: Normal Tone: Normal, no spasticity, rigidity, or clonus appreciated  Posture No gross abnormalities noted in standing or seated posture  Lumbar/Hip AROM: Limited knee to chest on right LE; +SLR (<40 deg)  Gait Very limited around 20 feet- unable to heel strike with right LE and obvious limp. She used trial rolling walker with some improvement yet still not able to perform heel strike.   Palpation Tenderness noted along right paraspinals; right posteriolateral hip/and lateral/post calf.    Strength R/L *3+/5 Hip flexion *3+5 Hip external rotation *3+/5 Hip internal rotation *3+/5 Hip  extension  *3+5 Hip abduction *3+/5 Hip adduction *3+/5 Knee extension *3+/5 Knee flexion *3+/5 Ankle Dorsiflexion *3+/5 Ankle Plantarflexion *3+/5 Ankle Inversion *5/5 Ankle Eversion *indicates pain  AROM Knee R/L Flexion: *90/WNL  Extension: *0/0 *indicates pain   Muscle Length Hamstring length (degrees): R/L:  40 deg / WNL  Passive Accessory Motion Superior Tibiofibular Joint: WNL* painful  NEUROLOGICAL:    Sensation Grossly intact to light touch bilateral LEs as determined by testing dermatomes L2-S2 Proprioception and hot/cold testing deferred on this date    VASCULAR Dorsalis pedis and posterior tibial pulses are palpable   ASSESSMENT Pt is a pleasant 86 year-old female referred for B knee OA and abnormality of gait. PT examination reveals deficits including right leg (hip down to foot pain/soreness) with limited ROM throughout and functional weakness. She exhibited increased difficulty with walking and presents with increased risk of falling. Pt will benefit from PT services to address deficits in strength, mobility, and pain in order to return to full function at home with less Right LE pain.   .                      Objective measurements completed on examination: See above findings.                   PT Short Term Goals - 05/09/21 2110       PT SHORT TERM GOAL #1   Title Pt will be independent with HEP in order to decrease ankle pain and  increase strength in order to improve pain-free function at home    Baseline Patient has no formal HEP in place    Time 6    Period Weeks    Status New    Target Date 06/20/21               PT Long Term Goals - 05/09/21 2127       PT LONG TERM GOAL #1   Title Pt will improve FOTO to target score of 45 to display perceived improvements in ability to complete ADL's    Baseline 05/08/2021=34    Time 12    Period Weeks    Status New    Target Date 07/31/21      PT LONG TERM  GOAL #2   Title Pt will decrease worst pain as reported on NPRS by at least 3 points in order to demonstrate clinically significant reduction in right hip/knee/ankle/foot pain.    Baseline 05/08/2021=6/10 with mobility    Time 12    Period Weeks    Status New    Target Date 07/31/21      PT LONG TERM GOAL #3   Title Pt will decrease 5TSTS by at least 3 seconds in order to demonstrate clinically significant improvement in LE strength.    Baseline 05/08/2021= 47.82 sec with min UE support    Time 12    Period Weeks    Status New    Target Date 07/31/21      PT LONG TERM GOAL #4   Title Pt will decrease TUG to below 18 seconds/decrease in order to demonstrate decreased fall risk.    Baseline 05/08/2021= 26.66 sec without an AD    Time 12    Period Weeks    Status New    Target Date 07/31/21      PT LONG TERM GOAL #5   Title Patient will demo independent ambulation on all surfaces > 500 feet with or without AD and no LOB or rest break required for short community distances.    Baseline 05/08/2021- Patient limited to around 20 feet of ambulation- limping without an AD with poor heel strike- limited by pain.    Time 12    Period Weeks    Status New    Target Date 07/31/21               Plan - 05/21/21 1004     Clinical Impression Statement Pt is a pleasant 86 year-old female referred for B knee OA and abnormality of gait. PT examination reveals deficits including right leg (hip down to foot pain/soreness) with limited ROM throughout and functional weakness. She exhibited increased difficulty with walking and presents with increased risk of falling. Pt will benefit from PT services to address deficits in strength, mobility, and pain in order to return to full function at home with less Right LE pain.    Examination-Activity Limitations Bend;Caring for Others;Carry;Lift;Squat;Stairs;Stand    Examination-Participation Restrictions Church;Cleaning;Community Activity;Driving;Yard Work     Conservation officer, historic buildings Evolving/Moderate complexity    Rehab Potential Good    PT Frequency 2x / week    PT Duration 12 weeks    PT Treatment/Interventions ADLs/Self Care Home Management;Cryotherapy;Moist Heat;Ultrasound;DME Instruction;Gait training;Stair training;Functional mobility training;Therapeutic activities;Therapeutic exercise;Balance training;Neuromuscular re-education;Patient/family education;Orthotic Fit/Training;Manual techniques;Compression bandaging;Passive range of motion;Dry needling;Taping;Joint Manipulations    PT Next Visit Plan Continue to assess Low back and hip for any pathology. Educated in knee ROM activities and gait training as appropriate.  PT Home Exercise Plan To be initiated next 1-2 visits    Consulted and Agree with Plan of Care Patient              Plan - 05/21/21 1004     Clinical Impression Statement Pt is a pleasant 86 year-old female referred for B knee OA and abnormality of gait. PT examination reveals deficits including right leg (hip down to foot pain/soreness) with limited ROM throughout and functional weakness. She exhibited increased difficulty with walking and presents with increased risk of falling. Pt will benefit from PT services to address deficits in strength, mobility, and pain in order to return to full function at home with less Right LE pain.    Examination-Activity Limitations Bend;Caring for Others;Carry;Lift;Squat;Stairs;Stand    Examination-Participation Restrictions Church;Cleaning;Community Activity;Driving;Yard Work    Conservation officer, historic buildings Evolving/Moderate complexity    Rehab Potential Good    PT Frequency 2x / week    PT Duration 12 weeks    PT Treatment/Interventions ADLs/Self Care Home Management;Cryotherapy;Moist Heat;Ultrasound;DME Instruction;Gait training;Stair training;Functional mobility training;Therapeutic activities;Therapeutic exercise;Balance training;Neuromuscular  re-education;Patient/family education;Orthotic Fit/Training;Manual techniques;Compression bandaging;Passive range of motion;Dry needling;Taping;Joint Manipulations    PT Next Visit Plan Continue to assess Low back and hip for any pathology. Educated in knee ROM activities and gait training as appropriate.    PT Home Exercise Plan To be initiated next 1-2 visits    Consulted and Agree with Plan of Care Patient                   Plan - 05/21/21 1004     Clinical Impression Statement Pt is a pleasant 86 year-old female referred for B knee OA and abnormality of gait. PT examination reveals deficits including right leg (hip down to foot pain/soreness) with limited ROM throughout and functional weakness. She exhibited increased difficulty with walking and presents with increased risk of falling. Pt will benefit from PT services to address deficits in strength, mobility, and pain in order to return to full function at home with less Right LE pain.    Examination-Activity Limitations Bend;Caring for Others;Carry;Lift;Squat;Stairs;Stand    Examination-Participation Restrictions Church;Cleaning;Community Activity;Driving;Yard Work    Conservation officer, historic buildings Evolving/Moderate complexity    Rehab Potential Good    PT Frequency 2x / week    PT Duration 12 weeks    PT Treatment/Interventions ADLs/Self Care Home Management;Cryotherapy;Moist Heat;Ultrasound;DME Instruction;Gait training;Stair training;Functional mobility training;Therapeutic activities;Therapeutic exercise;Balance training;Neuromuscular re-education;Patient/family education;Orthotic Fit/Training;Manual techniques;Compression bandaging;Passive range of motion;Dry needling;Taping;Joint Manipulations    PT Next Visit Plan Continue to assess Low back and hip for any pathology. Educated in knee ROM activities and gait training as appropriate.    PT Home Exercise Plan To be initiated next 1-2 visits    Consulted and Agree with Plan  of Care Patient              Patient will benefit from skilled therapeutic intervention in order to improve the following deficits and impairments:  Abnormal gait, Decreased activity tolerance, Decreased balance, Decreased endurance, Decreased knowledge of use of DME, Decreased mobility, Decreased strength, Decreased range of motion, Difficulty walking, Hypomobility, Impaired perceived functional ability, Impaired flexibility, Impaired sensation, Pain  Visit Diagnosis: Abnormality of gait and mobility - Plan: PT plan of care cert/re-cert  Difficulty in walking, not elsewhere classified - Plan: PT plan of care cert/re-cert  Muscle weakness (generalized) - Plan: PT plan of care cert/re-cert  Pain in right leg - Plan: PT plan of care cert/re-cert     Problem List There  are no problems to display for this patient.   Lenda KelpJeffrey N Meekah Math, PT 05/21/2021, 10:04 AM  [place] [facility] [address] [place], [place], [address] Phone: [phone]   Fax:  [phone]  Name: Connie Burns MRN: [id number] Date of Birth: 03/13/1936

## 2021-05-21 ENCOUNTER — Other Ambulatory Visit: Payer: Self-pay

## 2021-05-21 ENCOUNTER — Ambulatory Visit: Payer: Medicaid Other

## 2021-05-21 DIAGNOSIS — R262 Difficulty in walking, not elsewhere classified: Secondary | ICD-10-CM

## 2021-05-21 DIAGNOSIS — M6281 Muscle weakness (generalized): Secondary | ICD-10-CM

## 2021-05-21 DIAGNOSIS — R269 Unspecified abnormalities of gait and mobility: Secondary | ICD-10-CM

## 2021-05-21 NOTE — Therapy (Signed)
[place] [facility] [address] [place], [place], [address] Phone: [phone]   Fax:  [phone]  Physical Therapy Treatment  Patient Details  Name: Connie Burns MRN: [id number] Date of Birth: 01/27/1936 Referring Provider (PT): Thomasenia BottomsElizabeth Rasmussen, [place]   Encounter Date: 05/21/2021   PT End of Session - 05/21/21 1529     Visit Number 2    Number of Visits 25    Date for PT Re-Evaluation 07/31/21    Authorization Time Period 05/08/2021-07/31/2021    Progress Note Due on Visit 10    PT Start Time 1345    PT Stop Time 1429    PT Time Calculation (min) 44 min    Equipment Utilized During Treatment Gait belt    Activity Tolerance Patient limited by pain    Behavior During Therapy [facility] for tasks assessed/performed             Past Medical History:  Diagnosis Date   Coronary artery disease    Hypertension    Myocardial infarction (HCC)    2016   Tuberculosis    LATENT   TX 6 TO 8 MONTHS AGO    Past Surgical History:  Procedure Laterality Date   ABDOMINAL HYSTERECTOMY     CATARACT EXTRACTION W/PHACO Left 02/03/2018   Procedure: CATARACT EXTRACTION PHACO AND INTRAOCULAR LENS PLACEMENT (IOC);  Surgeon: Galen ManilaPorfilio, William, MD;  Location: [facility] ORS;  Service: Ophthalmology;  Laterality: Left;  US 00:36 CDE Fluid pack lot #[id number]   CATARACT EXTRACTION W/PHACO Right 03/03/2018   Procedure: CATARACT EXTRACTION PHACO AND INTRAOCULAR LENS PLACEMENT (IOC);  Surgeon: Galen ManilaPorfilio, William, MD;  Location: [facility] ORS;  Service: Ophthalmology;  Laterality: Right;  US  00:44  CDE 7.04 Fluid pack lot # [id number] H    There were no vitals filed for this visit.   Subjective Assessment - 05/21/21 1522     Subjective Patient presents to physical therapy excited to be present. Interpreter present. Has been having back and leg pain.    Patient is accompained by: Interpreter   Mardene CelesteJoanna   Pertinent History Connie Burns is a 86 y.o. female  past medical history of hypertension who presents for assessment approximately 2 weeks of nontraumatic pain in right leg.    Limitations Lifting;Standing;Walking;House hold activities    How long can you sit comfortably? no restrictions    How long can you stand comfortably? < 1 min    How long can you walk comfortably? <1 min    Diagnostic tests ED MD interpretation: Right lower extremity ultrasound shows no evidence of DVT, abscess, cyst or other acute process.    Patient Stated Goals I want to walk better - and be steady and less pain.    Currently in Pain? Yes    Pain Score 6     Pain Location Back   back and hip   Pain Orientation Right    Pain Descriptors / Indicators Aching;Sore    Pain Type Acute pain    Pain Onset 1 to 4 weeks ago            Interpreter present: Maritza   Treatment: Manual: SAD with inferior glide 5x 30 seconds SAD with inferior glide in figure 4 position; 2x 20 seconds Hamstring lengthening with RLE on PT shoulder 60 seconds  Palpation/mobilization of lumbar spine: patient unable to tolerate grade I; will call physician for imaging request  TherEx Nerve glide with leg on PT shoulder 15x   Education and  performance of HEP:   Access Code: [email or web address] URL: [email or web address] Date: 05/21/2021 Prepared by: Precious Bard  Exercises  Supine Lower Trunk Rotation - 1 x daily - 7 x weekly - 3 sets - 10 reps Supine Posterior Pelvic Tilt - 1 x daily - 7 x weekly - 2 sets - 10 reps - 5 hold Supine Sciatic Nerve Mobilization With Leg on Pillow - 1 x daily - 7 x weekly - 2 sets - 10 reps - 5 hold Seated Hamstring Stretch - 1 x daily - 7 x weekly - 2 sets - 10 reps - 5 hold Seated March - 1 x daily - 7 x weekly - 3 sets - 10 reps Seated Hip Adduction Isometrics with Ball - 1 x daily - 7 x weekly - 3 sets - 10 reps Seated Long Arc Quad - 1 x daily - 7 x weekly - 3 sets - 10 reps      Pt educated throughout session about proper posture and  technique with exercises. Improved exercise technique, movement at target joints, use of target muscles after min to mod verbal, visual, tactile cues.  Patient is very pleasant and eager to participate with therapy. She has extreme sensitivity to grade I mobilization to lumbar spine indicating potential need for imaging. This therapist has reached out to referring physician in regards to ordering imaging. HEP given to patient with patient demonstrating understanding. Pt will benefit from PT services to address deficits in strength, mobility, and pain in order to return to full function at home with less pain.                  PT Education - 05/21/21 1528     Education Details HEP, need for x rays    Person(s) Educated Patient    Methods Explanation;Demonstration;Tactile cues;Verbal cues    Comprehension Verbalized understanding;Returned demonstration;Verbal cues required;Tactile cues required              PT Short Term Goals - 05/09/21 2110       PT SHORT TERM GOAL #1   Title Pt will be independent with HEP in order to decrease ankle pain and increase strength in order to improve pain-free function at home    Baseline Patient has no formal HEP in place    Time 6    Period Weeks    Status New    Target Date 06/20/21               PT Long Term Goals - 05/09/21 2127       PT LONG TERM GOAL #1   Title Pt will improve FOTO to target score of 45 to display perceived improvements in ability to complete ADL's    Baseline 05/08/2021=34    Time 12    Period Weeks    Status New    Target Date 07/31/21      PT LONG TERM GOAL #2   Title Pt will decrease worst pain as reported on NPRS by at least 3 points in order to demonstrate clinically significant reduction in right hip/knee/ankle/foot pain.    Baseline 05/08/2021=6/10 with mobility    Time 12    Period Weeks    Status New    Target Date 07/31/21      PT LONG TERM GOAL #3   Title Pt will decrease 5TSTS by at  least 3 seconds in order to demonstrate clinically significant improvement in LE strength.    Baseline 05/08/2021= 47.82 sec with min  UE support    Time 12    Period Weeks    Status New    Target Date 07/31/21      PT LONG TERM GOAL #4   Title Pt will decrease TUG to below 18 seconds/decrease in order to demonstrate decreased fall risk.    Baseline 05/08/2021= 26.66 sec without an AD    Time 12    Period Weeks    Status New    Target Date 07/31/21      PT LONG TERM GOAL #5   Title Patient will demo independent ambulation on all surfaces > 500 feet with or without AD and no LOB or rest break required for short community distances.    Baseline 05/08/2021- Patient limited to around 20 feet of ambulation- limping without an AD with poor heel strike- limited by pain.    Time 12    Period Weeks    Status New    Target Date 07/31/21                   Plan - 05/21/21 1530     Clinical Impression Statement Patient is very pleasant and eager to participate with therapy. She has extreme sensitivity to grade I mobilization to lumbar spine indicating potential need for imaging. This therapist has reached out to referring physician in regards to ordering imaging. HEP given to patient with patient demonstrating understanding. Pt will benefit from PT services to address deficits in strength, mobility, and pain in order to return to full function at home with less pain.    Examination-Activity Limitations Bend;Caring for Others;Carry;Lift;Squat;Stairs;Stand    Examination-Participation Restrictions Church;Cleaning;Community Activity;Driving;Yard Work    Conservation officer, historic buildings Evolving/Moderate complexity    Rehab Potential Good    PT Frequency 2x / week    PT Duration 12 weeks    PT Treatment/Interventions ADLs/Self Care Home Management;Cryotherapy;Moist Heat;Ultrasound;DME Instruction;Gait training;Stair training;Functional mobility training;Therapeutic activities;Therapeutic  exercise;Balance training;Neuromuscular re-education;Patient/family education;Orthotic Fit/Training;Manual techniques;Compression bandaging;Passive range of motion;Dry needling;Taping;Joint Manipulations    PT Next Visit Plan Continue to assess Low back and hip for any pathology. Educated in knee ROM activities and gait training as appropriate.    PT Home Exercise Plan To be initiated next 1-2 visits    Consulted and Agree with Plan of Care Patient             Patient will benefit from skilled therapeutic intervention in order to improve the following deficits and impairments:  Abnormal gait, Decreased activity tolerance, Decreased balance, Decreased endurance, Decreased knowledge of use of DME, Decreased mobility, Decreased strength, Decreased range of motion, Difficulty walking, Hypomobility, Impaired perceived functional ability, Impaired flexibility, Impaired sensation, Pain  Visit Diagnosis: Abnormality of gait and mobility  Difficulty in walking, not elsewhere classified  Muscle weakness (generalized)     Problem List There are no problems to display for this patient.   Precious Bard, PT, DPT  05/21/2021, 3:31 PM  [place] [facility] MAIN [facility] SERVICES [address] [place], [place], [address] Phone: [phone]   Fax:  [phone]  Name: Ellawyn Wogan MRN: [id number] Date of Birth: [birth year]

## 2021-05-23 ENCOUNTER — Other Ambulatory Visit: Payer: Self-pay | Admitting: Physician Assistant

## 2021-05-23 ENCOUNTER — Other Ambulatory Visit (HOSPITAL_COMMUNITY): Payer: Self-pay | Admitting: Physician Assistant

## 2021-05-23 ENCOUNTER — Ambulatory Visit: Payer: Medicaid Other

## 2021-05-23 DIAGNOSIS — M1711 Unilateral primary osteoarthritis, right knee: Secondary | ICD-10-CM

## 2021-05-24 ENCOUNTER — Other Ambulatory Visit: Payer: Self-pay

## 2021-05-24 ENCOUNTER — Ambulatory Visit
Admission: RE | Admit: 2021-05-24 | Discharge: 2021-05-24 | Disposition: A | Discharge status: Home / Self Care | Payer: Medicaid Other | Attending: Physician Assistant | Admitting: Physician Assistant

## 2021-05-24 ENCOUNTER — Ambulatory Visit
Admission: RE | Admit: 2021-05-24 | Discharge: 2021-05-24 | Disposition: A | Discharge status: Home / Self Care | Payer: Medicaid Other | Source: Ambulatory Visit | Attending: Physician Assistant | Admitting: Physician Assistant

## 2021-05-24 DIAGNOSIS — M1711 Unilateral primary osteoarthritis, right knee: Secondary | ICD-10-CM | POA: Insufficient documentation

## 2021-05-25 ENCOUNTER — Other Ambulatory Visit: Payer: Self-pay | Admitting: Physician Assistant

## 2021-05-25 DIAGNOSIS — R293 Abnormal posture: Secondary | ICD-10-CM

## 2021-05-25 DIAGNOSIS — M1710 Unilateral primary osteoarthritis, unspecified knee: Secondary | ICD-10-CM

## 2021-05-28 ENCOUNTER — Other Ambulatory Visit: Payer: Self-pay

## 2021-05-28 ENCOUNTER — Ambulatory Visit: Payer: Medicaid Other

## 2021-05-28 DIAGNOSIS — M6281 Muscle weakness (generalized): Secondary | ICD-10-CM

## 2021-05-28 DIAGNOSIS — R269 Unspecified abnormalities of gait and mobility: Secondary | ICD-10-CM

## 2021-05-28 DIAGNOSIS — R2681 Unsteadiness on feet: Secondary | ICD-10-CM

## 2021-05-28 DIAGNOSIS — M79604 Pain in right leg: Secondary | ICD-10-CM

## 2021-05-28 DIAGNOSIS — R262 Difficulty in walking, not elsewhere classified: Secondary | ICD-10-CM

## 2021-05-28 NOTE — Therapy (Signed)
[facility] MAIN [facility] SERVICES [address] [place], [place], [address] Phone: [phone]   Fax:  [phone]  Physical Therapy Treatment  Patient Details  Name: Connie Burns MRN: [id number] Date of Birth: 08/12/1935 Referring Provider (PT): Thomasenia Bottoms, [place]   Encounter Date: 05/28/2021   PT End of Session - 05/28/21 1255     Visit Number 3    Number of Visits 25    Date for PT Re-Evaluation 07/31/21    Authorization Time Period 05/08/2021-07/31/2021    Progress Note Due on Visit 10    PT Start Time 0930    PT Stop Time 1015    PT Time Calculation (min) 45 min    Equipment Utilized During Treatment Gait belt    Activity Tolerance Patient limited by pain    Behavior During Therapy [facility] for tasks assessed/performed             Past Medical History:  Diagnosis Date   Coronary artery disease    Hypertension    Myocardial infarction (HCC)    2016   Tuberculosis    LATENT   TX 6 TO 8 MONTHS AGO    Past Surgical History:  Procedure Laterality Date   ABDOMINAL HYSTERECTOMY     CATARACT EXTRACTION W/PHACO Left 02/03/2018   Procedure: CATARACT EXTRACTION PHACO AND INTRAOCULAR LENS PLACEMENT (IOC);  Surgeon: Galen Manila, MD;  Location: [facility] ORS;  Service: Ophthalmology;  Laterality: Left;  [place] 00:36 CDE Fluid pack lot #[id number]   CATARACT EXTRACTION W/PHACO Right 03/03/2018   Procedure: CATARACT EXTRACTION PHACO AND INTRAOCULAR LENS PLACEMENT (IOC);  Surgeon: Galen Manila, MD;  Location: [facility] ORS;  Service: Ophthalmology;  Laterality: Right;  [place]  00:44  CDE 7.04 Fluid pack lot # [id number]    There were no vitals filed for this visit.   Subjective Assessment - 05/28/21 0939     Subjective My pain is about the same and I continue to have difficulty walking and my balance is not so good.    Patient is accompained by: Interpreter   Marchelle Folks   Pertinent History Kasarah Sitts is a 86 y.o. female past medical  history of hypertension who presents for assessment approximately 2 weeks of nontraumatic pain in right leg.    Limitations Lifting;Standing;Walking;House hold activities    How long can you sit comfortably? no restrictions    How long can you stand comfortably? < 1 min    How long can you walk comfortably? <1 min    Diagnostic tests ED MD interpretation: Right lower extremity ultrasound shows no evidence of DVT, abscess, cyst or other acute process.    Patient Stated Goals I want to walk better - and be steady and less pain.    Currently in Pain? Yes    Pain Score 6     Pain Location Back    Pain Orientation Right    Pain Descriptors / Indicators Aching;Sore    Pain Type Chronic pain    Pain Radiating Towards Right hip down to knee/ankle/Foot    Pain Onset More than a month ago    Pain Frequency Constant    Aggravating Factors  Prolonged sitting/Standing/walking    Pain Relieving Factors rest/Meds    Effect of Pain on Daily Activities Difficulty Performing all standing ADLs and walking             INTERVENTIONS:    Moist heat to low back while performing manual stretching to Low back/LE's  Manual: SAD with inferior glide 5x 30 seconds SAD with inferior glide in figure 4 position; 2x 20 seconds Hamstring lengthening with RLE on PT shoulder 60 seconds Manual lower trunk rotation x each side x 4 at 30 seconds each.  Use of massage stick to right side low back/gluteal region x 5 min   Educated in use of single point cane today on left side. Patient able to walk after instruction and walk > 100 feet with appropriate use of cane- Reporting decreased overall pain at end of session - 3/10. She demonstrated much improved ability to heel strike with less pain  Education provided throughout session via VC/TC and demonstration to facilitate movement at target joints and correct muscle activation for all testing and exercises performed.                        PT  Education - 05/28/21 0946     Education Details Review of HEP; Discussion about following up with MD regarding results of most recent x-ray.    Person(s) Educated Patient    Methods Explanation;Demonstration;Tactile cues;Verbal cues    Comprehension Verbalized understanding;Returned demonstration;Verbal cues required;Tactile cues required;Need further instruction              PT Short Term Goals - 05/09/21 2110       PT SHORT TERM GOAL #1   Title Pt will be independent with HEP in order to decrease ankle pain and increase strength in order to improve pain-free function at home    Baseline Patient has no formal HEP in place    Time 6    Period Weeks    Status New    Target Date 06/20/21               PT Long Term Goals - 05/09/21 2127       PT LONG TERM GOAL #1   Title Pt will improve FOTO to target score of 45 to display perceived improvements in ability to complete ADL's    Baseline 05/08/2021=34    Time 12    Period Weeks    Status New    Target Date 07/31/21      PT LONG TERM GOAL #2   Title Pt will decrease worst pain as reported on NPRS by at least 3 points in order to demonstrate clinically significant reduction in right hip/knee/ankle/foot pain.    Baseline 05/08/2021=6/10 with mobility    Time 12    Period Weeks    Status New    Target Date 07/31/21      PT LONG TERM GOAL #3   Title Pt will decrease 5TSTS by at least 3 seconds in order to demonstrate clinically significant improvement in LE strength.    Baseline 05/08/2021= 47.82 sec with min UE support    Time 12    Period Weeks    Status New    Target Date 07/31/21      PT LONG TERM GOAL #4   Title Pt will decrease TUG to below 18 seconds/decrease in order to demonstrate decreased fall risk.    Baseline 05/08/2021= 26.66 sec without an AD    Time 12    Period Weeks    Status New    Target Date 07/31/21      PT LONG TERM GOAL #5   Title Patient will demo independent ambulation on all surfaces >  500 feet with or without AD and no LOB or rest break required for  short community distances.    Baseline 05/08/2021- Patient limited to around 20 feet of ambulation- limping without an AD with poor heel strike- limited by pain.    Time 12    Period Weeks    Status New    Target Date 07/31/21                   Plan - 05/28/21 1256     Examination-Activity Limitations Bend;Caring for Others;Carry;Lift;Squat;Stairs;Stand    Examination-Participation Restrictions Church;Cleaning;Community Activity;Driving;Yard Work    Conservation officer, historic buildings Evolving/Moderate complexity    Rehab Potential Good    PT Frequency 2x / week    PT Duration 12 weeks    PT Treatment/Interventions ADLs/Self Care Home Management;Cryotherapy;Moist Heat;Ultrasound;DME Instruction;Gait training;Stair training;Functional mobility training;Therapeutic activities;Therapeutic exercise;Balance training;Neuromuscular re-education;Patient/family education;Orthotic Fit/Training;Manual techniques;Compression bandaging;Passive range of motion;Dry needling;Taping;Joint Manipulations    PT Next Visit Plan Continue to assess Low back and hip for any pathology. Educated in knee ROM activities and gait training as appropriate.    PT Home Exercise Plan no changes today.    Consulted and Agree with Plan of Care Patient             Patient will benefit from skilled therapeutic intervention in order to improve the following deficits and impairments:  Abnormal gait, Decreased activity tolerance, Decreased balance, Decreased endurance, Decreased knowledge of use of DME, Decreased mobility, Decreased strength, Decreased range of motion, Difficulty walking, Hypomobility, Impaired perceived functional ability, Impaired flexibility, Impaired sensation, Pain  Visit Diagnosis: Abnormality of gait and mobility  Difficulty in walking, not elsewhere classified  Muscle weakness (generalized)  Unsteadiness on feet  Pain in  right leg     Problem List There are no problems to display for this patient.   Lenda Kelp, PT 05/28/2021, 12:58 PM  [facility] MAIN [facility] SERVICES [address] [place], [place], [address] Phone: [phone]   Fax:  [phone]  Name: Kimila Papaleo MRN: [id number] Date of Birth: 08-04-1935

## 2021-05-30 ENCOUNTER — Other Ambulatory Visit: Payer: Self-pay

## 2021-05-30 ENCOUNTER — Ambulatory Visit: Payer: Medicaid Other | Attending: Physician Assistant | Admitting: Physical Therapy

## 2021-05-30 DIAGNOSIS — M79604 Pain in right leg: Secondary | ICD-10-CM | POA: Insufficient documentation

## 2021-05-30 DIAGNOSIS — R269 Unspecified abnormalities of gait and mobility: Secondary | ICD-10-CM | POA: Insufficient documentation

## 2021-05-30 DIAGNOSIS — R278 Other lack of coordination: Secondary | ICD-10-CM | POA: Insufficient documentation

## 2021-05-30 DIAGNOSIS — M6281 Muscle weakness (generalized): Secondary | ICD-10-CM | POA: Insufficient documentation

## 2021-05-30 DIAGNOSIS — R262 Difficulty in walking, not elsewhere classified: Secondary | ICD-10-CM | POA: Insufficient documentation

## 2021-05-30 DIAGNOSIS — R2681 Unsteadiness on feet: Secondary | ICD-10-CM | POA: Diagnosis present

## 2021-05-30 NOTE — Therapy (Signed)
[place] MAIN [facility] SERVICES [address] [place], [place], [address] Phone: [phone]   Fax:  [phone]  Physical Therapy Treatment  Patient Details  Name: Connie Burns MRN: [id number] Date of Birth: [birth year] Referring Provider (PT): Connie Burns, [place]   Encounter Date: 05/30/2021   PT End of Session - 05/30/21 1429     Visit Number 4    Number of Visits 25    Date for PT Re-Evaluation 07/31/21    Authorization Time Period 05/08/2021-07/31/2021    Progress Note Due on Visit 10    PT Start Time 0935    PT Stop Time 1017    PT Time Calculation (min) 42 min    Equipment Utilized During Treatment Gait belt    Activity Tolerance Patient tolerated treatment well    Behavior During Therapy [name] for tasks assessed/performed             Past Medical History:  Diagnosis Date   Coronary artery disease    Hypertension    Myocardial infarction ([place])    2016   Tuberculosis    LATENT   TX 6 TO 8 MONTHS AGO    Past Surgical History:  Procedure Laterality Date   ABDOMINAL HYSTERECTOMY     CATARACT EXTRACTION W/PHACO Left 02/03/2018   Procedure: CATARACT EXTRACTION PHACO AND INTRAOCULAR LENS PLACEMENT ([place]);  Surgeon: Connie Robson, MD;  Location: [facility] ORS;  Service: Ophthalmology;  Laterality: Left;  [place] 00:36 CDE Fluid pack lot #[id number]   CATARACT EXTRACTION W/PHACO Right 03/03/2018   Procedure: CATARACT EXTRACTION PHACO AND INTRAOCULAR LENS PLACEMENT (IOC);  Surgeon: Connie Robson, MD;  Location: [facility] ORS;  Service: Ophthalmology;  Laterality: Right;  [place]  00:44  CDE 7.04 Fluid pack lot # [id number] H    There were no vitals filed for this visit.   Subjective Assessment - 05/30/21 1426     Subjective Pt reports 2/10 pain in lumbar spine, right>left with pain radiating into hip and posterior thigh. States she has not called the doctor's office yet to recieve imaging results (PT encouraged). States using a cane is not  possible with her lifestyle as she is not able to carry her purse and a cane at the same time.    Patient is accompained by: Interpreter   Connie Burns   Pertinent History Besse Miron is a 86 y.o. female past medical history of hypertension who presents for assessment approximately 2 weeks of nontraumatic pain in right leg.    Limitations Lifting;Standing;Walking;House hold activities    How long can you sit comfortably? no restrictions    How long can you stand comfortably? < 1 min    How long can you walk comfortably? <1 min    Diagnostic tests ED MD interpretation: Right lower extremity ultrasound shows no evidence of DVT, abscess, cyst or other acute process.    Patient Stated Goals I want to walk better - and be steady and less pain.    Currently in Pain? Yes    Pain Score 2     Pain Location Back    Pain Orientation Right    Pain Descriptors / Indicators Aching;Sore    Pain Type Chronic pain    Pain Onset More than a month ago    Pain Frequency Constant                INTERVENTIONS:      Moist heat to low back while performing manual stretching to Low back/LE's  Interpreter:  Connie Burns    Manual: SAD using belt with inferior glide 5x 30 seconds SAD using belt with inferior glide in figure 4 position; 5 x 30 seconds LAD 2 x 30 seconds Hamstring lengthening with RLE on PT shoulder 2 x 60 seconds MET technique utilized for improved HS lengthening. Piriformis stretch in figure-4 2 x 60 seconds  Sciatic nerve flossing RLE on PT shoulder x10 Manual lower trunk rotation 4 x 30 second hold each Use of massage stick to right side low back/gluteal region x 6 min   Therex: Posterior pelvic tilt, 2 x 10  Assessment of pain in supine, sitting, standing and walking - pt reports 0/10 pain at end of session.  Although no pain, stability during ambulation appeared more impaired than upon arrival.    Education provided throughout session via VC/TC and demonstration to facilitate  movement at target joints and correct muscle activation for all testing and exercises performed.    Pt reports minimal to no pain during manual interventions this session. She did wince with pain during initial hamstring stretch; decreased HS length noted compared to LLE. PT again encouraged pt to call her PCP to discuss recent x-ray. Pt appears to be unstable during ambulation - may benefit from balance interventions in future sessions. Pt will benefit from PT services to address deficits in strength, mobility, and pain in order to return to full function at home with less pain.          PT Short Term Goals - 05/09/21 2110       PT SHORT TERM GOAL #1   Title Pt will be independent with HEP in order to decrease ankle pain and increase strength in order to improve pain-free function at home    Baseline Patient has no formal HEP in place    Time 6    Period Weeks    Status New    Target Date 06/20/21               PT Long Term Goals - 05/09/21 2127       PT LONG TERM GOAL #1   Title Pt will improve FOTO to target score of 45 to display perceived improvements in ability to complete ADL's    Baseline 05/08/2021=34    Time 12    Period Weeks    Status New    Target Date 07/31/21      PT LONG TERM GOAL #2   Title Pt will decrease worst pain as reported on NPRS by at least 3 points in order to demonstrate clinically significant reduction in right hip/knee/ankle/foot pain.    Baseline 05/08/2021=6/10 with mobility    Time 12    Period Weeks    Status New    Target Date 07/31/21      PT LONG TERM GOAL #3   Title Pt will decrease 5TSTS by at least 3 seconds in order to demonstrate clinically significant improvement in LE strength.    Baseline 05/08/2021= 47.82 sec with min UE support    Time 12    Period Weeks    Status New    Target Date 07/31/21      PT LONG TERM GOAL #4   Title Pt will decrease TUG to below 18 seconds/decrease in order to demonstrate decreased fall  risk.    Baseline 05/08/2021= 26.66 sec without an AD    Time 12    Period Weeks    Status New    Target Date 07/31/21  PT LONG TERM GOAL #5   Title Patient will demo independent ambulation on all surfaces > 500 feet with or without AD and no LOB or rest break required for short community distances.    Baseline 05/08/2021- Patient limited to around 20 feet of ambulation- limping without an AD with poor heel strike- limited by pain.    Time 12    Period Weeks    Status New    Target Date 07/31/21                   Plan - 05/30/21 1429     Clinical Impression Statement Pt reports minimal to no pain during manual interventions this session. She did wince with pain during initial hamstring stretch; decreased HS length noted compared to LLE. PT again encouraged pt to call her PCP to discuss recent x-ray. Pt appears to be unstable during ambulation - may benefit from balance interventions in future sessions. Pt will benefit from PT services to address deficits in strength, mobility, and pain in order to return to full function at home with less pain.    Examination-Activity Limitations Bend;Caring for Others;Carry;Lift;Squat;Stairs;Stand    Examination-Participation Restrictions Church;Cleaning;Community Activity;Driving;Yard Work    Merchant navy officer Evolving/Moderate complexity    Rehab Potential Good    PT Frequency 2x / week    PT Duration 12 weeks    PT Treatment/Interventions ADLs/Self Care Home Management;Cryotherapy;Moist Heat;Ultrasound;DME Instruction;Gait training;Stair training;Functional mobility training;Therapeutic activities;Therapeutic exercise;Balance training;Neuromuscular re-education;Patient/family education;Orthotic Fit/Training;Manual techniques;Compression bandaging;Passive range of motion;Dry needling;Taping;Joint Manipulations    PT Next Visit Plan Continue to assess Low back and hip for any pathology. Educated in knee ROM activities and gait  training as appropriate.    PT Home Exercise Plan no changes today.    Consulted and Agree with Plan of Care Patient             Patient will benefit from skilled therapeutic intervention in order to improve the following deficits and impairments:  Abnormal gait, Decreased activity tolerance, Decreased balance, Decreased endurance, Decreased knowledge of use of DME, Decreased mobility, Decreased strength, Decreased range of motion, Difficulty walking, Hypomobility, Impaired perceived functional ability, Impaired flexibility, Impaired sensation, Pain  Visit Diagnosis: Abnormality of gait and mobility  Difficulty in walking, not elsewhere classified  Muscle weakness (generalized)  Pain in right leg  Unsteadiness on feet     Problem List There are no problems to display for this patient.   Patrina Levering PT, DPT 05/30/21 2:56 PM [place] MAIN [facility] SERVICES [address] [place], [place], [address] Phone: [phone]   Fax:  [phone]  Name: Connie Burns MRN: [id number] Date of Birth: August 17, 1935

## 2021-06-04 ENCOUNTER — Ambulatory Visit: Payer: Medicaid Other

## 2021-06-04 DIAGNOSIS — R262 Difficulty in walking, not elsewhere classified: Secondary | ICD-10-CM

## 2021-06-04 DIAGNOSIS — M6281 Muscle weakness (generalized): Secondary | ICD-10-CM

## 2021-06-04 DIAGNOSIS — R269 Unspecified abnormalities of gait and mobility: Secondary | ICD-10-CM | POA: Diagnosis not present

## 2021-06-04 DIAGNOSIS — M79604 Pain in right leg: Secondary | ICD-10-CM

## 2021-06-04 DIAGNOSIS — R2681 Unsteadiness on feet: Secondary | ICD-10-CM

## 2021-06-04 NOTE — Therapy (Signed)
[place] [facility] MAIN [facility] SERVICES [address] [place], [place], [address] Phone: [phone]   Fax:  [phone]  Physical Therapy Treatment  Patient Details  Name: Connie Burns MRN: [id number] Date of Birth: January 12, 1936 Referring Provider (PT): Thomasenia Bottoms, [place]   Encounter Date: 06/04/2021   PT End of Session - 06/04/21 1225     Visit Number 5    Number of Visits 25    Date for PT Re-Evaluation 07/31/21    Authorization Time Period 05/08/2021-07/31/2021    Progress Note Due on Visit 10    PT Start Time 1015    PT Stop Time 1050    PT Time Calculation (min) 35 min    Equipment Utilized During Treatment Gait belt    Activity Tolerance Patient tolerated treatment well    Behavior During Therapy [facility] for tasks assessed/performed             Past Medical History:  Diagnosis Date   Coronary artery disease    Hypertension    Myocardial infarction (HCC)    2016   Tuberculosis    LATENT   TX 6 TO 8 MONTHS AGO    Past Surgical History:  Procedure Laterality Date   ABDOMINAL HYSTERECTOMY     CATARACT EXTRACTION W/PHACO Left 02/03/2018   Procedure: CATARACT EXTRACTION PHACO AND INTRAOCULAR LENS PLACEMENT (IOC);  Surgeon: Galen Manila, MD;  Location: [facility] ORS;  Service: Ophthalmology;  Laterality: Left;  [place] 00:36 CDE Fluid pack lot #[id number]   CATARACT EXTRACTION W/PHACO Right 03/03/2018   Procedure: CATARACT EXTRACTION PHACO AND INTRAOCULAR LENS PLACEMENT (IOC);  Surgeon: Galen Manila, MD;  Location: [facility] ORS;  Service: Ophthalmology;  Laterality: Right;  [place]  00:44  CDE 7.04 Fluid pack lot # [id number]    There were no vitals filed for this visit.   Subjective Assessment - 06/04/21 1024     Subjective Pt reports 2/10 pain in lumbar spine, right>left with pain radiating into hip and posterior thigh. States she has not called the doctor's office yet to recieve imaging results (PT encouraged). States using a cane is not  possible with her lifestyle as she is not able to carry her purse and a cane at the same time.    Patient is accompained by: Interpreter   Marchelle Folks   Pertinent History [name] is a 86 y.o. female past medical history of hypertension who presents for assessment approximately 2 weeks of nontraumatic pain in right leg.    Limitations Lifting;Standing;Walking;House hold activities    How long can you sit comfortably? no restrictions    How long can you stand comfortably? < 1 min    How long can you walk comfortably? <1 min    Diagnostic tests ED MD interpretation: Right lower extremity ultrasound shows no evidence of DVT, abscess, cyst or other acute process.    Patient Stated Goals I want to walk better - and be steady and less pain.    Pain Onset More than a month ago                 INTERVENTIONS:      Moist heat to low back while performing manual stretching to Low back/LE's   Interpreter: Annice Pih   Manual: Long axis distraction with inferior glide 5x 30 seconds Hamstring lengthening with RLE on PT shoulder 4 x 30 seconds Knee to chest- Hold 30 sec x 4 each leg Manual Lower trunk rotation- Hold 30 sec x 4 each leg  Sciatic nerve flossing RLE on PT shoulder x10 reps in 3 varying progressive SLR as tolerated. Patient denied any Low back or hamstring pain other than stretching sensation.    THEREX:   Instructed patient in Bridging 2 sets of 10 reps. VC to stay within painfree ROM - Some endrange low back pain with full bridge but able to modify per instruction and perform well with good gluteal/Core stabilization without increased pain.   Seated lower trunk flex with theraball  2 sets x 12 reps - No pain reported.   Reviewed walking- Patient able to demonstrate walking approx 120 feet total without an AD today with only minimal initial VC for heel strike.   Patient denied any pain throughout session today.   Education provided throughout session via VC/TC and  demonstration to facilitate movement at target joints and correct muscle activation for all testing and exercises performed.                      PT Short Term Goals - 05/09/21 2110       PT SHORT TERM GOAL #1   Title Pt will be independent with HEP in order to decrease ankle pain and increase strength in order to improve pain-free function at home    Baseline Patient has no formal HEP in place    Time 6    Period Weeks    Status New    Target Date 06/20/21               PT Long Term Goals - 05/09/21 2127       PT LONG TERM GOAL #1   Title Pt will improve FOTO to target score of 45 to display perceived improvements in ability to complete ADL's    Baseline 05/08/2021=34    Time 12    Period Weeks    Status New    Target Date 07/31/21      PT LONG TERM GOAL #2   Title Pt will decrease worst pain as reported on NPRS by at least 3 points in order to demonstrate clinically significant reduction in right hip/knee/ankle/foot pain.    Baseline 05/08/2021=6/10 with mobility    Time 12    Period Weeks    Status New    Target Date 07/31/21      PT LONG TERM GOAL #3   Title Pt will decrease 5TSTS by at least 3 seconds in order to demonstrate clinically significant improvement in LE strength.    Baseline 05/08/2021= 47.82 sec with min UE support    Time 12    Period Weeks    Status New    Target Date 07/31/21      PT LONG TERM GOAL #4   Title Pt will decrease TUG to below 18 seconds/decrease in order to demonstrate decreased fall risk.    Baseline 05/08/2021= 26.66 sec without an AD    Time 12    Period Weeks    Status New    Target Date 07/31/21      PT LONG TERM GOAL #5   Title Patient will demo independent ambulation on all surfaces > 500 feet with or without AD and no LOB or rest break required for short community distances.    Baseline 05/08/2021- Patient limited to around 20 feet of ambulation- limping without an AD with poor heel strike- limited by  pain.    Time 12    Period Weeks    Status New  Target Date 07/31/21                   Plan - 06/04/21 1220     Clinical Impression Statement Patient presented today with excellent motivation and demonstrated much improved overall gait ability- Walking without a limp and progressed to good heel to toe gait sequencing without sign of antalgic gait. She was able to perform modified bridge and demonstrated good flexibility with manual stretching. Continued to encourage patient to contact MD to discuss results of most recent imaging. Pt will benefit from PT services to address deficits in strength, mobility, and pain in order to return to full function at home with less pain.    Examination-Activity Limitations Bend;Caring for Others;Carry;Lift;Squat;Stairs;Stand    Examination-Participation Restrictions Church;Cleaning;Community Activity;Driving;Yard Work    Conservation officer, historic buildings Evolving/Moderate complexity    Rehab Potential Good    PT Frequency 2x / week    PT Duration 12 weeks    PT Treatment/Interventions ADLs/Self Care Home Management;Cryotherapy;Moist Heat;Ultrasound;DME Instruction;Gait training;Stair training;Functional mobility training;Therapeutic activities;Therapeutic exercise;Balance training;Neuromuscular re-education;Patient/family education;Orthotic Fit/Training;Manual techniques;Compression bandaging;Passive range of motion;Dry needling;Taping;Joint Manipulations    PT Next Visit Plan Continue to assess Low back and hip for any pathology. Educated in knee ROM activities and gait training as appropriate.    PT Home Exercise Plan no changes today.    Consulted and Agree with Plan of Care Patient             Patient will benefit from skilled therapeutic intervention in order to improve the following deficits and impairments:  Abnormal gait, Decreased activity tolerance, Decreased balance, Decreased endurance, Decreased knowledge of use of DME, Decreased  mobility, Decreased strength, Decreased range of motion, Difficulty walking, Hypomobility, Impaired perceived functional ability, Impaired flexibility, Impaired sensation, Pain  Visit Diagnosis: Abnormality of gait and mobility  Difficulty in walking, not elsewhere classified  Muscle weakness (generalized)  Unsteadiness on feet  Pain in right leg     Problem List There are no problems to display for this patient.   Lenda Kelp, PT 06/04/2021, 12:25 PM  [place] [facility] MAIN [facility] SERVICES [address] [place], [place], [address] Phone: [phone]   Fax:  [phone]  Name: Lezlee Gills MRN: [id number] Date of Birth: Jan 26, 1936

## 2021-06-06 ENCOUNTER — Ambulatory Visit: Payer: Medicaid Other

## 2021-06-06 ENCOUNTER — Other Ambulatory Visit: Payer: Self-pay

## 2021-06-06 DIAGNOSIS — R269 Unspecified abnormalities of gait and mobility: Secondary | ICD-10-CM | POA: Diagnosis not present

## 2021-06-06 DIAGNOSIS — M6281 Muscle weakness (generalized): Secondary | ICD-10-CM

## 2021-06-06 DIAGNOSIS — R2681 Unsteadiness on feet: Secondary | ICD-10-CM

## 2021-06-06 DIAGNOSIS — R262 Difficulty in walking, not elsewhere classified: Secondary | ICD-10-CM

## 2021-06-06 DIAGNOSIS — R278 Other lack of coordination: Secondary | ICD-10-CM

## 2021-06-06 NOTE — Therapy (Signed)
[place] [facility] MAIN [facility] SERVICES [address] [place], [place], [address] Phone: [phone]   Fax:  [phone]  Physical Therapy Treatment  Patient Details  Name: Connie Burns MRN: [id number] Date of Birth: July 21, 1935 Referring Provider (PT): Thomasenia Bottoms, [place]   Encounter Date: 06/06/2021   PT End of Session - 06/06/21 1022     Visit Number 6    Number of Visits 25    Date for PT Re-Evaluation 07/31/21    Authorization Time Period 05/08/2021-07/31/2021    Progress Note Due on Visit 10    PT Start Time 0935    PT Stop Time 1013    PT Time Calculation (min) 38 min    Equipment Utilized During Treatment Gait belt    Activity Tolerance Patient tolerated treatment well    Behavior During Therapy [facility] for tasks assessed/performed             Past Medical History:  Diagnosis Date   Coronary artery disease    Hypertension    Myocardial infarction (HCC)    2016   Tuberculosis    LATENT   TX 6 TO 8 MONTHS AGO    Past Surgical History:  Procedure Laterality Date   ABDOMINAL HYSTERECTOMY     CATARACT EXTRACTION W/PHACO Left 02/03/2018   Procedure: CATARACT EXTRACTION PHACO AND INTRAOCULAR LENS PLACEMENT (IOC);  Surgeon: Galen Manila, MD;  Location: [facility] ORS;  Service: Ophthalmology;  Laterality: Left;  [place] 00:36 CDE Fluid pack lot #[id number]   CATARACT EXTRACTION W/PHACO Right 03/03/2018   Procedure: CATARACT EXTRACTION PHACO AND INTRAOCULAR LENS PLACEMENT (IOC);  Surgeon: Galen Manila, MD;  Location: [facility] ORS;  Service: Ophthalmology;  Laterality: Right;  [place]  00:44  CDE 7.04 Fluid pack lot # [id number]    There were no vitals filed for this visit.   Subjective Assessment - 06/06/21 0945     Subjective Pt reports she is wanting a walker due to difficulty with balance.    Patient is accompained by: Interpreter   Marchelle Folks   Pertinent History Champayne Kocian is a 86 y.o. female past medical history of hypertension who  presents for assessment approximately 2 weeks of nontraumatic pain in right leg.    Limitations Lifting;Standing;Walking;House hold activities    How long can you sit comfortably? no restrictions    How long can you stand comfortably? < 1 min    How long can you walk comfortably? <1 min    Diagnostic tests ED MD interpretation: Right lower extremity ultrasound shows no evidence of DVT, abscess, cyst or other acute process.    Patient Stated Goals I want to walk better - and be steady and less pain.    Currently in Pain? No/denies    Pain Onset More than a month ago               INTERVENTIONS:   Interpreter: Maritza  Patient denied any pain and reports she needs work on her balance. She states reluctant to use a cane due to the feeling that it makes her feel more unsteady and inquiring about a Walker. Discussion about how to obtain a prescription of walker will need to be done by PCP and then she can take the referral to a local Medical Supply store.   Initiated more balance activities today:  Neuromuscular Re-ed:  Static standing in corner performing the following:   Feet together- eyes open x 20 sec x 3 - Patient with mild unsteadiness initially -  improved with practice.  Feet together- eyes closed x 20 sec x 4  - Patient required light touch on support bar.  Feet staggered - Eyes open x 20 sec x 4- Patient unsteady but improved each set.  Feet tandem- Eyes open x 20 sec x 2- Patient unsteady - with more lateral sway.   Instruction in sit to stand with no UE support x 10 reps.   Education provided throughout session via VC/TC and demonstration to facilitate movement at target joints and correct muscle activation for all testing and exercises performed.                            PT Education - 06/06/21 0949     Education Details Exercise technique    Person(s) Educated Patient    Methods Explanation;Demonstration;Tactile cues;Verbal cues     Comprehension Verbalized understanding;Returned demonstration;Verbal cues required;Tactile cues required;Need further instruction              PT Short Term Goals - 05/09/21 2110       PT SHORT TERM GOAL #1   Title Pt will be independent with HEP in order to decrease ankle pain and increase strength in order to improve pain-free function at home    Baseline Patient has no formal HEP in place    Time 6    Period Weeks    Status New    Target Date 06/20/21               PT Long Term Goals - 05/09/21 2127       PT LONG TERM GOAL #1   Title Pt will improve FOTO to target score of 45 to display perceived improvements in ability to complete ADL's    Baseline 05/08/2021=34    Time 12    Period Weeks    Status New    Target Date 07/31/21      PT LONG TERM GOAL #2   Title Pt will decrease worst pain as reported on NPRS by at least 3 points in order to demonstrate clinically significant reduction in right hip/knee/ankle/foot pain.    Baseline 05/08/2021=6/10 with mobility    Time 12    Period Weeks    Status New    Target Date 07/31/21      PT LONG TERM GOAL #3   Title Pt will decrease 5TSTS by at least 3 seconds in order to demonstrate clinically significant improvement in LE strength.    Baseline 05/08/2021= 47.82 sec with min UE support    Time 12    Period Weeks    Status New    Target Date 07/31/21      PT LONG TERM GOAL #4   Title Pt will decrease TUG to below 18 seconds/decrease in order to demonstrate decreased fall risk.    Baseline 05/08/2021= 26.66 sec without an AD    Time 12    Period Weeks    Status New    Target Date 07/31/21      PT LONG TERM GOAL #5   Title Patient will demo independent ambulation on all surfaces > 500 feet with or without AD and no LOB or rest break required for short community distances.    Baseline 05/08/2021- Patient limited to around 20 feet of ambulation- limping without an AD with poor heel strike- limited by pain.    Time 12     Period Weeks    Status New  Target Date 07/31/21                   Plan - 06/06/21 1023     Clinical Impression Statement Patient arrived today on her birthday with excellent motivation and no pain reported. She was able to progress to more balance activities today. She was slightly impulsive with mobility and required re-direction at times to stay on tasks. She was able to complete requested tasks without any report of pain. Pt will benefit from PT services to address deficits in strength, mobility, and pain in order to return to full function at home with less pain.    Examination-Activity Limitations Bend;Caring for Others;Carry;Lift;Squat;Stairs;Stand    Examination-Participation Restrictions Church;Cleaning;Community Activity;Driving;Yard Work    Conservation officer, historic buildings Evolving/Moderate complexity    Rehab Potential Good    PT Frequency 2x / week    PT Duration 12 weeks    PT Treatment/Interventions ADLs/Self Care Home Management;Cryotherapy;Moist Heat;Ultrasound;DME Instruction;Gait training;Stair training;Functional mobility training;Therapeutic activities;Therapeutic exercise;Balance training;Neuromuscular re-education;Patient/family education;Orthotic Fit/Training;Manual techniques;Compression bandaging;Passive range of motion;Dry needling;Taping;Joint Manipulations    PT Next Visit Plan Continue to assess Low back and hip for any pathology. Educated in knee ROM activities and gait training as appropriate.    PT Home Exercise Plan no changes today.    Consulted and Agree with Plan of Care Patient             Patient will benefit from skilled therapeutic intervention in order to improve the following deficits and impairments:  Abnormal gait, Decreased activity tolerance, Decreased balance, Decreased endurance, Decreased knowledge of use of DME, Decreased mobility, Decreased strength, Decreased range of motion, Difficulty walking, Hypomobility, Impaired  perceived functional ability, Impaired flexibility, Impaired sensation, Pain  Visit Diagnosis: Other lack of coordination  Abnormality of gait and mobility  Difficulty in walking, not elsewhere classified  Muscle weakness (generalized)  Unsteadiness on feet     Problem List There are no problems to display for this patient.   Lenda Kelp, PT 06/06/2021, 11:06 AM  [address] [facility] MAIN [facility] SERVICES [address] [place], [place], [address] Phone: [phone]   Fax:  [phone]  Name: Milica Mohrbacher MRN: [id number] Date of Birth: 08-Feb-1936

## 2021-06-11 ENCOUNTER — Ambulatory Visit: Payer: Medicaid Other | Admitting: Physical Therapy

## 2021-06-11 ENCOUNTER — Other Ambulatory Visit: Payer: Self-pay

## 2021-06-11 DIAGNOSIS — M79604 Pain in right leg: Secondary | ICD-10-CM

## 2021-06-11 DIAGNOSIS — R269 Unspecified abnormalities of gait and mobility: Secondary | ICD-10-CM

## 2021-06-11 DIAGNOSIS — R262 Difficulty in walking, not elsewhere classified: Secondary | ICD-10-CM

## 2021-06-11 DIAGNOSIS — R2681 Unsteadiness on feet: Secondary | ICD-10-CM

## 2021-06-11 DIAGNOSIS — M6281 Muscle weakness (generalized): Secondary | ICD-10-CM

## 2021-06-11 DIAGNOSIS — R278 Other lack of coordination: Secondary | ICD-10-CM

## 2021-06-11 NOTE — Therapy (Signed)
[facility] MAIN [facility] SERVICES [address] [place], [place], [address] Phone: [phone]   Fax:  [phone]  Physical Therapy Treatment  Patient Details  Name: Connie Burns MRN: [id number] Date of Birth: 01/02/1936 Referring Provider (PT): Thomasenia Bottoms, [place]   Encounter Date: 06/11/2021   PT End of Session - 06/11/21 1233     Visit Number 7    Number of Visits 25    Date for PT Re-Evaluation 07/31/21    Authorization Time Period 05/08/2021-07/31/2021    Progress Note Due on Visit 10    PT Start Time 1018    PT Stop Time 1100    PT Time Calculation (min) 42 min    Equipment Utilized During Treatment Gait belt    Activity Tolerance Patient tolerated treatment well    Behavior During Therapy [facility] for tasks assessed/performed             Past Medical History:  Diagnosis Date   Coronary artery disease    Hypertension    Myocardial infarction (HCC)    2016   Tuberculosis    LATENT   TX 6 TO 8 MONTHS AGO    Past Surgical History:  Procedure Laterality Date   ABDOMINAL HYSTERECTOMY     CATARACT EXTRACTION W/PHACO Left 02/03/2018   Procedure: CATARACT EXTRACTION PHACO AND INTRAOCULAR LENS PLACEMENT (IOC);  Surgeon: Galen Manila, MD;  Location: [facility] ORS;  Service: Ophthalmology;  Laterality: Left;  [place] 00:36 CDE Fluid pack lot #[id number]   CATARACT EXTRACTION W/PHACO Right 03/03/2018   Procedure: CATARACT EXTRACTION PHACO AND INTRAOCULAR LENS PLACEMENT (IOC);  Surgeon: Galen Manila, MD;  Location: [facility] ORS;  Service: Ophthalmology;  Laterality: Right;  [place]  00:44  CDE 7.04 Fluid pack lot # [id number]    There were no vitals filed for this visit.    INTERVENTIONS:    Interpreter: Amanda  Manual Therapy Hamstring stretch 2x60 seconds BLE  *decreased standing stability after stretching requiring UE assist to ambulate 48ft to chair in gym   Therapeutic Exercise In sitting: LAQ 2# AW, 3x10 BLE Hip flexion  march w/ no back support, 3x10 BLE (not alternating)  Standing at support bar: Hip extension, 2x10 BLE Hip abduction, 2x10 BLE *bilateral knee buckle occurred after first set in standing   Sit to stand with no UE support x 5 reps.   Neuromuscular Re-ed Standing at support bar: Feet together- eyes open x 30 sec x 3. Feet together- eyes closed x 30 sec x 3.  Patient with mild unsteadiness with each. Shaking of bilateral legs due to weakness/fatigue. Pain reported in posterior thigh and knee - pt states this is normal after standing for "long" periods. Pain started after ~1 minute of standing.   Screen of LE strength - MMT performed in sitting. In general 3+ to 4- out of 5 BLE, left weaker than right. No pain reported during MMT.     Education provided throughout session via VC/TC and demonstration to facilitate movement at target joints and correct muscle activation for all testing and exercises performed.    Clinical Impression: Pt presents with good motivation. Education is provided on falls risk/safety; pt inquired about wearing heels and therapist informed pt that she is currently not safe to do so due to LE weakness, vertebral fx and fall risk. Pt then attempted to walk on her toes to demo ability to do so - MIN A required for heavy steadying as pt demo decreased reaction time and stepping strategy.  After standing for 2-3 minutes for balance exercises with occasional knee buckling in each LE, strength was screened and found to be significantly limited. Focus transitioned to strengthening. Pt experienced 1 instance of bilateral knee buckling requiring MAX A of therapist to prevent fall. Pt required UE assist of PT and/or translator for short distance ambulation on multiple occasions. Pt lacks safety awareness as she wanted to walk out to the front of the hospital on her own to "see what happens." PT called for volunteer to bring chair down. Pt will benefit from PT services to address deficits  in strength, mobility, and pain in order to return to full function at home with less pain.         PT Short Term Goals - 05/09/21 2110       PT SHORT TERM GOAL #1   Title Pt will be independent with HEP in order to decrease ankle pain and increase strength in order to improve pain-free function at home    Baseline Patient has no formal HEP in place    Time 6    Period Weeks    Status New    Target Date 06/20/21               PT Long Term Goals - 05/09/21 2127       PT LONG TERM GOAL #1   Title Pt will improve FOTO to target score of 45 to display perceived improvements in ability to complete ADL's    Baseline 05/08/2021=34    Time 12    Period Weeks    Status New    Target Date 07/31/21      PT LONG TERM GOAL #2   Title Pt will decrease worst pain as reported on NPRS by at least 3 points in order to demonstrate clinically significant reduction in right hip/knee/ankle/foot pain.    Baseline 05/08/2021=6/10 with mobility    Time 12    Period Weeks    Status New    Target Date 07/31/21      PT LONG TERM GOAL #3   Title Pt will decrease 5TSTS by at least 3 seconds in order to demonstrate clinically significant improvement in LE strength.    Baseline 05/08/2021= 47.82 sec with min UE support    Time 12    Period Weeks    Status New    Target Date 07/31/21      PT LONG TERM GOAL #4   Title Pt will decrease TUG to below 18 seconds/decrease in order to demonstrate decreased fall risk.    Baseline 05/08/2021= 26.66 sec without an AD    Time 12    Period Weeks    Status New    Target Date 07/31/21      PT LONG TERM GOAL #5   Title Patient will demo independent ambulation on all surfaces > 500 feet with or without AD and no LOB or rest break required for short community distances.    Baseline 05/08/2021- Patient limited to around 20 feet of ambulation- limping without an AD with poor heel strike- limited by pain.    Time 12    Period Weeks    Status New    Target  Date 07/31/21                   Plan - 06/11/21 1234     Clinical Impression Statement Pt presents with good motivation. Education is provided on falls risk/safety; pt inquired about wearing heels and  therapist informed pt that she is currently not safe to do so due to LE weakness, vertebral fx and fall risk. Pt then attempted to walk on her toes to demo ability to do so - MIN A required for heavy steadying as pt demo decreased reaction time and stepping strategy. After standing for 2-3 minutes for balance exercises with occasional knee buckling in each LE, strength was screened and found to be significantly limited. Focus transitioned to strengthening. Pt experienced 1 instance of bilateral knee buckling requiring MAX A of therapist to prevent fall. Pt required UE assist of PT and/or translator for short distance ambulation on multiple occasions. Pt lacks safety awareness as she wanted to walk out to the front of the hospital on her own to "see what happens." PT called for volunteer to bring chair down. Pt will benefit from PT services to address deficits in strength, mobility, and pain in order to return to full function at home with less pain.    Examination-Activity Limitations Bend;Caring for Others;Carry;Lift;Squat;Stairs;Stand    Examination-Participation Restrictions Church;Cleaning;Community Activity;Driving;Yard Work    Conservation officer, historic buildings Evolving/Moderate complexity    Rehab Potential Good    PT Frequency 2x / week    PT Duration 12 weeks    PT Treatment/Interventions ADLs/Self Care Home Management;Cryotherapy;Moist Heat;Ultrasound;DME Instruction;Gait training;Stair training;Functional mobility training;Therapeutic activities;Therapeutic exercise;Balance training;Neuromuscular re-education;Patient/family education;Orthotic Fit/Training;Manual techniques;Compression bandaging;Passive range of motion;Dry needling;Taping;Joint Manipulations    PT Next Visit Plan  Continue to assess Low back and hip for any pathology. Educated in knee ROM activities and gait training as appropriate.    PT Home Exercise Plan no changes today.    Consulted and Agree with Plan of Care Patient             Patient will benefit from skilled therapeutic intervention in order to improve the following deficits and impairments:  Abnormal gait, Decreased activity tolerance, Decreased balance, Decreased endurance, Decreased knowledge of use of DME, Decreased mobility, Decreased strength, Decreased range of motion, Difficulty walking, Hypomobility, Impaired perceived functional ability, Impaired flexibility, Impaired sensation, Pain  Visit Diagnosis: Other lack of coordination  Abnormality of gait and mobility  Difficulty in walking, not elsewhere classified  Muscle weakness (generalized)  Unsteadiness on feet  Pain in right leg     Problem List There are no problems to display for this patient.   Basilia Jumbo PT, DPT 06/11/21 12:36 PM [phone]   [facility] MAIN [facility] SERVICES [address] [place], [place], [address] Phone: [phone]   Fax:  [phone]  Name: Connie Burns MRN: [id number] Date of Birth: [birth year]

## 2021-06-13 ENCOUNTER — Other Ambulatory Visit: Payer: Self-pay

## 2021-06-13 ENCOUNTER — Ambulatory Visit: Payer: Medicaid Other

## 2021-06-13 DIAGNOSIS — R269 Unspecified abnormalities of gait and mobility: Secondary | ICD-10-CM

## 2021-06-13 DIAGNOSIS — R278 Other lack of coordination: Secondary | ICD-10-CM

## 2021-06-13 DIAGNOSIS — R262 Difficulty in walking, not elsewhere classified: Secondary | ICD-10-CM

## 2021-06-13 NOTE — Therapy (Signed)
[place] [facility] MAIN [facility] SERVICES [address] [place], [place], [address] Phone: [phone]   Fax:  [phone]  Physical Therapy Treatment  Patient Details  Name: Connie Burns MRN: [id number] Date of Birth: January 24, 1936 Referring Provider (PT): Thomasenia Bottoms, [place]   Encounter Date: 06/13/2021   PT End of Session - 06/13/21 1147     Visit Number 8    Number of Visits 25    Date for PT Re-Evaluation 07/31/21    Authorization Time Period 05/08/2021-07/31/2021    Progress Note Due on Visit 10    PT Start Time 0930    PT Stop Time 1014    PT Time Calculation (min) 44 min    Equipment Utilized During Treatment Gait belt    Activity Tolerance Patient tolerated treatment well    Behavior During Therapy [facility] for tasks assessed/performed             Past Medical History:  Diagnosis Date   Coronary artery disease    Hypertension    Myocardial infarction (HCC)    2016   Tuberculosis    LATENT   TX 6 TO 8 MONTHS AGO    Past Surgical History:  Procedure Laterality Date   ABDOMINAL HYSTERECTOMY     CATARACT EXTRACTION W/PHACO Left 02/03/2018   Procedure: CATARACT EXTRACTION PHACO AND INTRAOCULAR LENS PLACEMENT (IOC);  Surgeon: Galen Manila, MD;  Location: [facility] ORS;  Service: Ophthalmology;  Laterality: Left;  [place] 00:36 CDE Fluid pack lot #[id number]   CATARACT EXTRACTION W/PHACO Right 03/03/2018   Procedure: CATARACT EXTRACTION PHACO AND INTRAOCULAR LENS PLACEMENT (IOC);  Surgeon: Galen Manila, MD;  Location: [facility] ORS;  Service: Ophthalmology;  Laterality: Right;  [place]  00:44  CDE 7.04 Fluid pack lot # [id number] H    There were no vitals filed for this visit.   Subjective Assessment - 06/13/21 0954     Subjective Pt reports she does not want to get a walker until she is d/c from PT. Mentions wanting to return to wearing high heels. Denies pain. No LOB/falls.    Patient is accompained by: Interpreter   Marchelle Folks   Pertinent History  Connie Burns is a 86 y.o. female past medical history of hypertension who presents for assessment approximately 2 weeks of nontraumatic pain in right leg.    Limitations Lifting;Standing;Walking;House hold activities    How long can you sit comfortably? no restrictions    How long can you stand comfortably? < 1 min    How long can you walk comfortably? <1 min    Diagnostic tests ED MD interpretation: Right lower extremity ultrasound shows no evidence of DVT, abscess, cyst or other acute process.    Patient Stated Goals I want to walk better - and be steady and less pain.    Pain Onset More than a month ago             Interpreter: Marchelle Folks      Therapeutic Exercise In sitting: Heel toe raises 20x  Hamstring stretch 30 seconds x 2 trials each LE   Sit to stand with no UE support x 5 reps.   Ambulate 500 ft with CGA cue to frequent instability and scissoring pattern of gait    Neuromuscular Re-ed  Standing with CGA next to support surface:  Airex pad: static stand 30 seconds x 2 trials, noticeable trembling of ankles/LE's with fatigue and challenge to maintain stability Airex pad: horizontal head turns 30 seconds scanning room 10x ; cueing  for arc of motion  Airex pad: vertical head turns 30 seconds, cueing for arc of motion, noticeable sway with upward gaze increasing demand on ankle righting reaction musculature Airex pad: one foot on 6" step one foot on airex pad, hold position for 30 seconds, switch legs, 2x each LE;  Feet together- eyes closed x 30 sec x 3.   Patient with mild unsteadiness with each. Shaking of bilateral legs due to weakness/fatigue. Pain reported in posterior thigh and knee - pt states this is normal after standing for "long" periods.    Sit on dynadisc: 4" step under feet.  -lateral weight shiftsx2 minutes -alternating LAQ 10x each LE    Education provided throughout session via VC/TC and demonstration to facilitate movement at target joints and  correct muscle activation for all testing and exercises performed.    Vestibular screen due to patient complaint of dizziness. (NO CHARGE)  -Dix Hall-pike testing: negative B. Roll testing: negative B. Smooth pursuits: normal. Saccades: slight decrease in speed B.   Patient reports she has dizziness with quick turn of her head and movement. Quick vestibular screen performed with patient being negative. Patient requires assistance with ambulation back to car due to frequent instability and multiple near LOB. Core stabilization interventions introduced and tolerated well. Pt will benefit from PT services to address deficits in strength, mobility, and pain in order to return to full function at home with less pain.                       PT Education - 06/13/21 1142     Education Details exercise technique, body mechanics, vestibular screen    Person(s) Educated Patient    Methods Explanation;Demonstration;Tactile cues;Verbal cues    Comprehension Verbalized understanding;Returned demonstration;Tactile cues required;Verbal cues required              PT Short Term Goals - 05/09/21 2110       PT SHORT TERM GOAL #1   Title Pt will be independent with HEP in order to decrease ankle pain and increase strength in order to improve pain-free function at home    Baseline Patient has no formal HEP in place    Time 6    Period Weeks    Status New    Target Date 06/20/21               PT Long Term Goals - 05/09/21 2127       PT LONG TERM GOAL #1   Title Pt will improve FOTO to target score of 45 to display perceived improvements in ability to complete ADL's    Baseline 05/08/2021=34    Time 12    Period Weeks    Status New    Target Date 07/31/21      PT LONG TERM GOAL #2   Title Pt will decrease worst pain as reported on NPRS by at least 3 points in order to demonstrate clinically significant reduction in right hip/knee/ankle/foot pain.    Baseline  05/08/2021=6/10 with mobility    Time 12    Period Weeks    Status New    Target Date 07/31/21      PT LONG TERM GOAL #3   Title Pt will decrease 5TSTS by at least 3 seconds in order to demonstrate clinically significant improvement in LE strength.    Baseline 05/08/2021= 47.82 sec with min UE support    Time 12    Period Weeks  Status New    Target Date 07/31/21      PT LONG TERM GOAL #4   Title Pt will decrease TUG to below 18 seconds/decrease in order to demonstrate decreased fall risk.    Baseline 05/08/2021= 26.66 sec without an AD    Time 12    Period Weeks    Status New    Target Date 07/31/21      PT LONG TERM GOAL #5   Title Patient will demo independent ambulation on all surfaces > 500 feet with or without AD and no LOB or rest break required for short community distances.    Baseline 05/08/2021- Patient limited to around 20 feet of ambulation- limping without an AD with poor heel strike- limited by pain.    Time 12    Period Weeks    Status New    Target Date 07/31/21                   Plan - 06/13/21 1148     Clinical Impression Statement Patient reports she has dizziness with quick turn of her head and movement. Quick vestibular screen performed with patient being negative. Patient requires assistance with ambulation back to car due to frequent instability and multiple near LOB. Core stabilization interventions introduced and tolerated well. Pt will benefit from PT services to address deficits in strength, mobility, and pain in order to return to full function at home with less pain.    Examination-Activity Limitations Bend;Caring for Others;Carry;Lift;Squat;Stairs;Stand    Examination-Participation Restrictions Church;Cleaning;Community Activity;Driving;Yard Work    Conservation officer, historic buildings Evolving/Moderate complexity    Rehab Potential Good    PT Frequency 2x / week    PT Duration 12 weeks    PT Treatment/Interventions ADLs/Self Care Home  Management;Cryotherapy;Moist Heat;Ultrasound;DME Instruction;Gait training;Stair training;Functional mobility training;Therapeutic activities;Therapeutic exercise;Balance training;Neuromuscular re-education;Patient/family education;Orthotic Fit/Training;Manual techniques;Compression bandaging;Passive range of motion;Dry needling;Taping;Joint Manipulations    PT Next Visit Plan Continue to assess Low back and hip for any pathology. Educated in knee ROM activities and gait training as appropriate.    PT Home Exercise Plan no changes today.    Consulted and Agree with Plan of Care Patient             Patient will benefit from skilled therapeutic intervention in order to improve the following deficits and impairments:  Abnormal gait, Decreased activity tolerance, Decreased balance, Decreased endurance, Decreased knowledge of use of DME, Decreased mobility, Decreased strength, Decreased range of motion, Difficulty walking, Hypomobility, Impaired perceived functional ability, Impaired flexibility, Impaired sensation, Pain  Visit Diagnosis: Other lack of coordination  Abnormality of gait and mobility  Difficulty in walking, not elsewhere classified     Problem List There are no problems to display for this patient.  Precious Bard, PT, DPT  06/13/2021, 11:49 AM  [facility] MAIN [facility] SERVICES [address] [place], [place], [address] Phone: [phone]   Fax:  [phone]  Name: Aiyannah Fayad MRN: [id number] Date of Birth: Sep 25, 1935

## 2021-06-18 ENCOUNTER — Other Ambulatory Visit: Payer: Self-pay

## 2021-06-18 ENCOUNTER — Ambulatory Visit: Payer: Medicaid Other

## 2021-06-18 DIAGNOSIS — R269 Unspecified abnormalities of gait and mobility: Secondary | ICD-10-CM | POA: Diagnosis not present

## 2021-06-18 DIAGNOSIS — R278 Other lack of coordination: Secondary | ICD-10-CM

## 2021-06-18 DIAGNOSIS — R262 Difficulty in walking, not elsewhere classified: Secondary | ICD-10-CM

## 2021-06-18 DIAGNOSIS — M6281 Muscle weakness (generalized): Secondary | ICD-10-CM

## 2021-06-18 DIAGNOSIS — R2681 Unsteadiness on feet: Secondary | ICD-10-CM

## 2021-06-18 NOTE — Therapy (Signed)
[place] MAIN [facility] SERVICES [address] [place], [place], [address] Phone: [phone]   Fax:  [phone]  Physical Therapy Treatment  Patient Details  Name: Connie Burns MRN: [id number] Date of Birth: February 07, 1936 Referring Provider (PT): Thomasenia Bottoms, [place]   Encounter Date: 06/18/2021   PT End of Session - 06/18/21 4166     Visit Number 9    Number of Visits 25    Date for PT Re-Evaluation 07/31/21    Authorization Time Period 05/08/2021-07/31/2021    Progress Note Due on Visit 10    PT Start Time 0930    PT Stop Time 1014    PT Time Calculation (min) 44 min    Equipment Utilized During Treatment Gait belt    Activity Tolerance Patient tolerated treatment well    Behavior During Therapy [facility] for tasks assessed/performed             Past Medical History:  Diagnosis Date   Coronary artery disease    Hypertension    Myocardial infarction (HCC)    2016   Tuberculosis    LATENT   TX 6 TO 8 MONTHS AGO    Past Surgical History:  Procedure Laterality Date   ABDOMINAL HYSTERECTOMY     CATARACT EXTRACTION W/PHACO Left 02/03/2018   Procedure: CATARACT EXTRACTION PHACO AND INTRAOCULAR LENS PLACEMENT (IOC);  Surgeon: Galen Manila, MD;  Location: [facility] ORS;  Service: Ophthalmology;  Laterality: Left;  [place] 00:36 CDE Fluid pack lot #[id number]   CATARACT EXTRACTION W/PHACO Right 03/03/2018   Procedure: CATARACT EXTRACTION PHACO AND INTRAOCULAR LENS PLACEMENT (IOC);  Surgeon: Galen Manila, MD;  Location: [facility] ORS;  Service: Ophthalmology;  Laterality: Right;  [place]  00:44  CDE 7.04 Fluid pack lot # [id number]    There were no vitals filed for this visit.   Subjective Assessment - 06/18/21 0926     Subjective I am having no pain today- but feels like a weight in my low back. Denies any falls    Patient is accompained by: Interpreter   Connie Burns   Pertinent History Connie Burns is a 86 y.o. female past medical history of  hypertension who presents for assessment approximately 2 weeks of nontraumatic pain in right leg.    Limitations Lifting;Standing;Walking;House hold activities    How long can you sit comfortably? no restrictions    How long can you stand comfortably? < 1 min    How long can you walk comfortably? <1 min    Diagnostic tests ED MD interpretation: Right lower extremity ultrasound shows no evidence of DVT, abscess, cyst or other acute process.    Patient Stated Goals I want to walk better - and be steady and less pain.    Currently in Pain? No/denies    Pain Onset --              INTERVENTIONS:   Interpreter: Amanda  Therapeutic Exercises:   Sit to stand without UE support  Patient reported mild headache after BP= 146/77 mmHg (Sit Left UE) BP= 135/70 mmHg (standing Left UE) - asymptomatic   Step ups: without UE support x 15 reps each leg- Mild difficulty with coordination.  Standing hip ext Standing hip abd Standing Calf raises using 1/2 foam roll  Neuromuscular re-education:   Step up/over 1/2 foam roll: 15 reps each side with mild difficulty with coordination requiring occasional UE reach for support bar.   1/2 tandem standing- No UE support - attempting to hold 10  sec- multiple attempts today- unsteady with intermittent reaching for support bar.  Single leg stance-each LE x mult attempts- only able to hold 2-7 sec at best - instructed to add to HEP and practice everyday at home as long as she felt safe.      Access Code: [email or web address] URL: [email or web address] Date: 06/18/2021 Prepared by: Connie Burns  Exercises Sit to Stand with Arms Crossed - 1 x daily - 3 x weekly - 3 sets - 10 reps Standing Romberg to 1/2 Tandem Stance - 1 x daily - 3 x weekly - 3 sets - 4 reps - 10-20 sec hold Step Up - 1 x daily - 3 x weekly - 3 sets - 10 reps Single Leg Stance - 1 x daily - 7 x weekly - 3 sets - 10 reps   Education provided throughout session via VC/TC  and demonstration to facilitate movement at target joints and correct muscle activation for all testing and exercises performed.                     PT Education - 06/18/21 0926     Education Details Exercise technique    Person(s) Educated Patient    Methods Explanation;Demonstration;Tactile cues;Verbal cues    Comprehension Verbalized understanding;Returned demonstration;Verbal cues required;Tactile cues required;Need further instruction              PT Short Term Goals - 05/09/21 2110       PT SHORT TERM GOAL #1   Title Pt will be independent with HEP in order to decrease ankle pain and increase strength in order to improve pain-free function at home    Baseline Patient has no formal HEP in place    Time 6    Period Weeks    Status New    Target Date 06/20/21               PT Long Term Goals - 05/09/21 2127       PT LONG TERM GOAL #1   Title Pt will improve FOTO to target score of 45 to display perceived improvements in ability to complete ADL's    Baseline 05/08/2021=34    Time 12    Period Weeks    Status New    Target Date 07/31/21      PT LONG TERM GOAL #2   Title Pt will decrease worst pain as reported on NPRS by at least 3 points in order to demonstrate clinically significant reduction in right hip/knee/ankle/foot pain.    Baseline 05/08/2021=6/10 with mobility    Time 12    Period Weeks    Status New    Target Date 07/31/21      PT LONG TERM GOAL #3   Title Pt will decrease 5TSTS by at least 3 seconds in order to demonstrate clinically significant improvement in LE strength.    Baseline 05/08/2021= 47.82 sec with min UE support    Time 12    Period Weeks    Status New    Target Date 07/31/21      PT LONG TERM GOAL #4   Title Pt will decrease TUG to below 18 seconds/decrease in order to demonstrate decreased fall risk.    Baseline 05/08/2021= 26.66 sec without an AD    Time 12    Period Weeks    Status New    Target Date 07/31/21       PT LONG TERM GOAL #5   Title Patient will demo independent  ambulation on all surfaces > 500 feet with or without AD and no LOB or rest break required for short community distances.    Baseline 05/08/2021- Patient limited to around 20 feet of ambulation- limping without an AD with poor heel strike- limited by pain.    Time 12    Period Weeks    Status New    Target Date 07/31/21                   Plan - 06/18/21 6606     Clinical Impression Statement Patient performed well today- able to add to LE strengthening and balance today without any report of pain. She was well motivated and responded well to VC's for best technique today. She denied any dizziness or pain throughout session but demo difficulty with static balance activities. Pt will benefit from PT services to address deficits in strength, mobility, and pain in order to return to full function at home    Examination-Activity Limitations Bend;Caring for Others;Carry;Lift;Squat;Stairs;Stand    Examination-Participation Restrictions Church;Cleaning;Community Activity;Driving;Yard Work    Conservation officer, historic buildings Evolving/Moderate complexity    Rehab Potential Good    PT Frequency 2x / week    PT Duration 12 weeks    PT Treatment/Interventions ADLs/Self Care Home Management;Cryotherapy;Moist Heat;Ultrasound;DME Instruction;Gait training;Stair training;Functional mobility training;Therapeutic activities;Therapeutic exercise;Balance training;Neuromuscular re-education;Patient/family education;Orthotic Fit/Training;Manual techniques;Compression bandaging;Passive range of motion;Dry needling;Taping;Joint Manipulations    PT Next Visit Plan Continue to assess Low back and hip for any pathology. Educated in knee ROM activities and gait training as appropriate.    PT Home Exercise Plan no changes today.    Consulted and Agree with Plan of Care Patient             Patient will benefit from skilled therapeutic  intervention in order to improve the following deficits and impairments:  Abnormal gait, Decreased activity tolerance, Decreased balance, Decreased endurance, Decreased knowledge of use of DME, Decreased mobility, Decreased strength, Decreased range of motion, Difficulty walking, Hypomobility, Impaired perceived functional ability, Impaired flexibility, Impaired sensation, Pain  Visit Diagnosis: Other lack of coordination  Difficulty in walking, not elsewhere classified  Muscle weakness (generalized)  Unsteadiness on feet     Problem List There are no problems to display for this patient.   Connie Burns, PT 06/18/2021, 10:34 AM  [facility] MAIN [facility] SERVICES [address] [place], [place], [address] Phone: [phone]   Fax:  [phone]  Name: Connie Burns MRN: [id number] Date of Birth: 1935/11/11

## 2021-06-20 ENCOUNTER — Other Ambulatory Visit: Payer: Self-pay

## 2021-06-20 ENCOUNTER — Ambulatory Visit: Payer: Medicaid Other

## 2021-06-20 DIAGNOSIS — R269 Unspecified abnormalities of gait and mobility: Secondary | ICD-10-CM

## 2021-06-20 DIAGNOSIS — R2681 Unsteadiness on feet: Secondary | ICD-10-CM

## 2021-06-20 DIAGNOSIS — R262 Difficulty in walking, not elsewhere classified: Secondary | ICD-10-CM

## 2021-06-20 DIAGNOSIS — M6281 Muscle weakness (generalized): Secondary | ICD-10-CM

## 2021-06-20 NOTE — Therapy (Signed)
[place] MAIN [facility] SERVICES [address] [place], [place], [address] Phone: [phone]   Fax:  [phone]  Physical Therapy Treatment/Physical Therapy Progress Note   Dates of reporting period  05/08/21   to   06/20/21   Patient Details  Name: Connie Burns MRN: [id number] Date of Birth: 07-Dec-1935 Referring Provider (PT): Armstead Peaks, [place]   Encounter Date: 06/20/2021   PT End of Session - 06/20/21 1000     Visit Number 10    Number of Visits 25    Date for PT Re-Evaluation 07/31/21    Authorization Time Period 05/08/2021-07/31/2021    Progress Note Due on Visit 10    PT Start Time 0930    PT Stop Time 7253    PT Time Calculation (min) 44 min    Equipment Utilized During Treatment Gait belt    Activity Tolerance Patient tolerated treatment well    Behavior During Therapy [facility] for tasks assessed/performed             Past Medical History:  Diagnosis Date   Coronary artery disease    Hypertension    Myocardial infarction ([place])    2016   Tuberculosis    LATENT   TX 6 TO 8 MONTHS AGO    Past Surgical History:  Procedure Laterality Date   ABDOMINAL HYSTERECTOMY     CATARACT EXTRACTION W/PHACO Left 02/03/2018   Procedure: CATARACT EXTRACTION PHACO AND INTRAOCULAR LENS PLACEMENT ([place]);  Surgeon: Birder Robson, MD;  Location: [facility] ORS;  Service: Ophthalmology;  Laterality: Left;  [place] 00:36 CDE Fluid pack lot #[id number]   CATARACT EXTRACTION W/PHACO Right 03/03/2018   Procedure: CATARACT EXTRACTION PHACO AND INTRAOCULAR LENS PLACEMENT (IOC);  Surgeon: Birder Robson, MD;  Location: [facility] ORS;  Service: Ophthalmology;  Laterality: Right;  [place]  00:44  CDE 7.04 Fluid pack lot # [id number] H    There were no vitals filed for this visit.   Subjective Assessment - 06/20/21 1000     Subjective Patient reports no pain, has been compliant with HEP. Wants to stop therapy in march due to a lot of doctor appointments.     Patient is accompained by: Interpreter   Connie Burns   Pertinent History Connie Burns is a 86 y.o. female past medical history of hypertension who presents for assessment approximately 2 weeks of nontraumatic pain in right leg.    Limitations Lifting;Standing;Walking;House hold activities    How long can you sit comfortably? no restrictions    How long can you stand comfortably? < 1 min    How long can you walk comfortably? <1 min    Diagnostic tests ED MD interpretation: Right lower extremity ultrasound shows no evidence of DVT, abscess, cyst or other acute process.    Patient Stated Goals I want to walk better - and be steady and less pain.    Currently in Pain? No/denies                 Barrister's clerk present  BP : 160/69   Goals:  Hep: compliant  Foto: 39 Vas: worst pain: 1/10  5xsts: 16.17 without hands Tug: 18 seconds Ambulate 500 ft: performed with multiple near LOB in 4 minutes and 16 seconds   Treatment:  airex pad 6" step modified tandem stance 30 seconds each LE placement x2 trials each LE   Patient's condition has the potential to improve in response to therapy. Maximum improvement is yet to be obtained. The anticipated improvement is  attainable and reasonable in a generally predictable time.  Patient reports she is improving but wants to work on her balance.     Pt educated throughout session about proper posture and technique with exercises. Improved exercise technique, movement at target joints, use of target muscles after min to mod verbal, visual, tactile cues.  Patient reports her leg is improving, her back is improving and only bothers her sometimes. She thinks she may want to finish with therapy in March due to a lot of physician appointments Patient's back pain has improved significantly with only 1/10 pain at worst noted. She is able to ambulate further duration but has limited stability requiring close CGA. Patient has intermittent confusion  throughout session requiring occasional re-direction and re-orientation. Patient's condition has the potential to improve in response to therapy. Maximum improvement is yet to be obtained. The anticipated improvement is attainable and reasonable in a generally predictable time.  Pt will benefit from PT services to address deficits in strength, mobility, and pain in order to return to full function at home               PT Education - 06/20/21 0928     Education Details goals, progress note    Person(s) Educated Patient    Methods Explanation;Demonstration;Tactile cues;Verbal cues    Comprehension Verbalized understanding;Returned demonstration;Verbal cues required;Tactile cues required              PT Short Term Goals - 05/09/21 2110       PT SHORT TERM GOAL #1   Title Pt will be independent with HEP in order to decrease ankle pain and increase strength in order to improve pain-free function at home    Baseline Patient has no formal HEP in place    Time 6    Period Weeks    Status New    Target Date 06/20/21               PT Long Term Goals - 06/20/21 0939       PT LONG TERM GOAL #1   Title Pt will improve FOTO to target score of 45 to display perceived improvements in ability to complete ADL's    Baseline 05/08/2021=34 2/22: 39%    Time 12    Period Weeks    Status Partially Met    Target Date 07/31/21      PT LONG TERM GOAL #2   Title Pt will decrease worst pain as reported on NPRS by at least 3 points in order to demonstrate clinically significant reduction in right hip/knee/ankle/foot pain.    Baseline 05/08/2021=6/10 with mobility 2/22: 1/10    Time 12    Period Weeks    Status Achieved    Target Date 07/31/21      PT LONG TERM GOAL #3   Title Pt will decrease 5TSTS by at least 3 seconds in order to demonstrate clinically significant improvement in LE strength.    Baseline 05/08/2021= 47.82 sec with min UE support 2/22: 16.17 seconds without hands     Time 12    Period Weeks    Status Achieved    Target Date 07/31/21      PT LONG TERM GOAL #4   Title Pt will decrease TUG to below 18 seconds/decrease in order to demonstrate decreased fall risk.    Baseline 05/08/2021= 26.66 sec without an AD 2/22: 18 seconds    Time 12    Period Weeks    Status Partially Met  Target Date 07/31/21      PT LONG TERM GOAL #5   Title Patient will demo independent ambulation on all surfaces > 500 feet with or without AD and no LOB or rest break required for short community distances.    Baseline 05/08/2021- Patient limited to around 20 feet of ambulation- limping without an AD with poor heel strike- limited by pain. 2/22: performed with multiple near LOB in 4 minutes and 16 seconds    Time 12    Period Weeks    Status Partially Met    Target Date 07/31/21                   Plan - 06/20/21 1325     Clinical Impression Statement Patient reports her leg is improving, her back is improving and only bothers her sometimes. She thinks she may want to finish with therapy in March due to a lot of physician appointments Patient's back pain has improved significantly with only 1/10 pain at worst noted. She is able to ambulate further duration but has limited stability requiring close CGA. Patient has intermittent confusion throughout session requiring occasional re-direction and re-orientation. Patient's condition has the potential to improve in response to therapy. Maximum improvement is yet to be obtained. The anticipated improvement is attainable and reasonable in a generally predictable time.  Pt will benefit from PT services to address deficits in strength, mobility, and pain in order to return to full function at home    Examination-Activity Limitations Bend;Caring for Others;Carry;Lift;Squat;Stairs;Stand    Examination-Participation Restrictions Church;Cleaning;Community Activity;Driving;Yard Work    Merchant navy officer  Evolving/Moderate complexity    Rehab Potential Good    PT Frequency 2x / week    PT Duration 12 weeks    PT Treatment/Interventions ADLs/Self Care Home Management;Cryotherapy;Moist Heat;Ultrasound;DME Instruction;Gait training;Stair training;Functional mobility training;Therapeutic activities;Therapeutic exercise;Balance training;Neuromuscular re-education;Patient/family education;Orthotic Fit/Training;Manual techniques;Compression bandaging;Passive range of motion;Dry needling;Taping;Joint Manipulations    PT Next Visit Plan make sure she still wants to d/c in march    PT Home Exercise Plan no changes today.    Consulted and Agree with Plan of Care Patient             Patient will benefit from skilled therapeutic intervention in order to improve the following deficits and impairments:  Abnormal gait, Decreased activity tolerance, Decreased balance, Decreased endurance, Decreased knowledge of use of DME, Decreased mobility, Decreased strength, Decreased range of motion, Difficulty walking, Hypomobility, Impaired perceived functional ability, Impaired flexibility, Impaired sensation, Pain  Visit Diagnosis: Difficulty in walking, not elsewhere classified  Muscle weakness (generalized)  Unsteadiness on feet  Abnormality of gait and mobility     Problem List There are no problems to display for this patient.  Janna Arch, PT, DPT  06/20/2021, 1:26 PM  [place] MAIN [facility] SERVICES [address] [place], [place], [address] Phone: [phone]   Fax:  [phone]  Name: Connie Burns MRN: [id number] Date of Birth: 06/10/1935

## 2021-06-25 ENCOUNTER — Ambulatory Visit: Payer: Medicaid Other

## 2021-06-25 ENCOUNTER — Other Ambulatory Visit: Payer: Self-pay

## 2021-06-25 DIAGNOSIS — R269 Unspecified abnormalities of gait and mobility: Secondary | ICD-10-CM | POA: Diagnosis not present

## 2021-06-25 DIAGNOSIS — R262 Difficulty in walking, not elsewhere classified: Secondary | ICD-10-CM

## 2021-06-25 DIAGNOSIS — M6281 Muscle weakness (generalized): Secondary | ICD-10-CM

## 2021-06-25 DIAGNOSIS — R2681 Unsteadiness on feet: Secondary | ICD-10-CM

## 2021-06-25 NOTE — Therapy (Signed)
[place] MAIN [facility] SERVICES [address] [place], [place], [address] Phone: [phone]   Fax:  [phone]  Physical Therapy Treatment  Patient Details  Name: Connie Burns MRN: [id number] Date of Birth: 1936-03-06 Referring Provider (PT): Armstead Peaks, [place]   Encounter Date: 06/25/2021   PT End of Session - 06/25/21 1339     Visit Number 11    Number of Visits 25    Date for PT Re-Evaluation 07/31/21    Authorization Time Period 05/08/2021-07/31/2021    Progress Note Due on Visit 10    PT Start Time 0930    PT Stop Time 1003    PT Time Calculation (min) 33 min    Equipment Utilized During Treatment Gait belt    Activity Tolerance Patient tolerated treatment well    Behavior During Therapy Connie Burns for tasks assessed/performed             Past Medical History:  Diagnosis Date   Coronary artery disease    Hypertension    Myocardial infarction ([place])    2016   Tuberculosis    LATENT   TX 6 TO 8 MONTHS AGO    Past Surgical History:  Procedure Laterality Date   ABDOMINAL HYSTERECTOMY     CATARACT EXTRACTION W/PHACO Left 02/03/2018   Procedure: CATARACT EXTRACTION PHACO AND INTRAOCULAR LENS PLACEMENT ([facility]);  Surgeon: Birder Robson, MD;  Location: [facility] ORS;  Service: Ophthalmology;  Laterality: Left;  [place] 00:36 CDE Fluid pack lot #[id number]   CATARACT EXTRACTION W/PHACO Right 03/03/2018   Procedure: CATARACT EXTRACTION PHACO AND INTRAOCULAR LENS PLACEMENT ([facility]);  Surgeon: Birder Robson, MD;  Location: [facility] ORS;  Service: Ophthalmology;  Laterality: Right;  [place]  00:44  CDE 7.04 Fluid pack lot # [id number]    There were no vitals filed for this visit.   Subjective Assessment - 06/25/21 1337     Subjective Patient reports continuing to do better- states no pain, falls since last visit. States she would like to come one more time to make sure she is continuing to improve.    Patient is accompained by: Interpreter   Connie Burns    Pertinent History Connie Burns is a 86 y.o. female past medical history of hypertension who presents for assessment approximately 2 weeks of nontraumatic pain in right leg.    Limitations Lifting;Standing;Walking;House hold activities    How long can you sit comfortably? no restrictions    How long can you stand comfortably? < 1 min    How long can you walk comfortably? <1 min    Diagnostic tests ED MD interpretation: Right lower extremity ultrasound shows no evidence of DVT, abscess, cyst or other acute process.    Patient Stated Goals I want to walk better - and be steady and less pain.    Currently in Pain? No/denies                  INTERVENTIONS:   TUG= 12.88 without AD  5 x STS= 11.23 sec without AD  Gait in hallway x 80 feet without any AD- patient without unsteadiness looking straight ahead Gait in hallway x 80 feet with horizontal head turns x 2 - Calling out items on sticky notes. - No LOB or unsteadiness exhibited.  Pain= Denies.   Review of HEP:  LE strengthening- Hip march Hip abd  Hip ext [place] Calf raises 1st set of 10 reps with use of support bar focusing on slow muscle control.  2nd set  of 12 reps without use of support bar- focusing more on balance- VC to slow down  Tandem stance x 20 sec hold x 2 trials each leg- No LOB and patient able to maintain position.   Single leg stance - several attempts to hold 5 sec - patient able to attain at most 5 sec each leg - multiple bouts of unsteadiness.   Education provided throughout session via VC/TC and demonstration to facilitate movement at target joints and correct muscle activation for all testing and exercises performed.                     PT Education - 06/25/21 1338     Education Details Review of home program; purpose of functional outcome testing    Person(s) Educated Patient    Methods Explanation;Demonstration;Tactile cues;Verbal cues    Comprehension Verbalized  understanding;Returned demonstration;Verbal cues required;Tactile cues required;Need further instruction              PT Short Term Goals - 05/09/21 2110       PT SHORT TERM GOAL #1   Title Pt will be independent with HEP in order to decrease ankle pain and increase strength in order to improve pain-free function at home    Baseline Patient has no formal HEP in place    Time 6    Period Weeks    Status New    Target Date 06/20/21               PT Long Term Goals - 06/25/21 1341       PT LONG TERM GOAL #1   Title Pt will improve FOTO to target score of 45 to display perceived improvements in ability to complete ADL's    Baseline 05/08/2021=34 2/22: 39%    Time 12    Period Weeks    Status Partially Met    Target Date 07/31/21      PT LONG TERM GOAL #2   Title Pt will decrease worst pain as reported on NPRS by at least 3 points in order to demonstrate clinically significant reduction in right hip/knee/ankle/foot pain.    Baseline 05/08/2021=6/10 with mobility 2/22: 1/10; 06/25/2021= Patient denies any pain    Time 12    Period Weeks    Status Achieved    Target Date 07/31/21      PT LONG TERM GOAL #3   Title Pt will decrease 5TSTS by at least 3 seconds in order to demonstrate clinically significant improvement in LE strength.    Baseline 05/08/2021= 47.82 sec with min UE support 2/22: 16.17 seconds without hands; 06/25/2021=11.23 sec without AD    Time 12    Period Weeks    Status Achieved    Target Date 07/31/21      PT LONG TERM GOAL #4   Title Pt will decrease TUG to below 18 seconds/decrease in order to demonstrate decreased fall risk.    Baseline 05/08/2021= 26.66 sec without an AD 2/22: 18 seconds; 06/25/2021= 12.88 sec without AD    Time 12    Period Weeks    Status Achieved    Target Date 07/31/21      PT LONG TERM GOAL #5   Title Patient will demo independent ambulation on all surfaces > 500 feet with or without AD and no LOB or rest break required for  short community distances.    Baseline 05/08/2021- Patient limited to around 20 feet of ambulation- limping without an AD with poor  heel strike- limited by pain. 2/22: performed with multiple near LOB in 4 minutes and 16 seconds    Time 12    Period Weeks    Status Partially Met    Target Date 07/31/21                   Plan - 06/25/21 1017     Clinical Impression Statement Patient with good recall of HEP with only cues to slow down for more muscle control and later for improved balance. She vastly improved in 5xSTS from last week as well as TUG indicating improved functional strength and decreased risk of falling. Patient requested one more session versus discharge to ensure she was doing well and able to transition from skilled care to self-care. She denied any pain today. Will plan for possible discharge visit next visit per patient request.    Examination-Activity Limitations Bend;Caring for Others;Carry;Lift;Squat;Stairs;Stand    Examination-Participation Restrictions Church;Cleaning;Community Activity;Driving;Yard Work    [employer] officer Evolving/Moderate complexity    Rehab Potential Good    PT Frequency 2x / week    PT Duration 12 weeks    PT Treatment/Interventions ADLs/Self Care Home Management;Cryotherapy;Moist Heat;Ultrasound;DME Instruction;Gait training;Stair training;Functional mobility training;Therapeutic activities;Therapeutic exercise;Balance training;Neuromuscular re-education;Patient/family education;Orthotic Fit/Training;Manual techniques;Compression bandaging;Passive range of motion;Dry needling;Taping;Joint Manipulations    PT Next Visit Plan Discharge next visit per patient request    PT Home Exercise Plan Reviewed standing LE Strengthening and balance HEP    Consulted and Agree with Plan of Care Patient             Patient will benefit from skilled therapeutic intervention in order to improve the following deficits and impairments:   Abnormal gait, Decreased activity tolerance, Decreased balance, Decreased endurance, Decreased knowledge of use of DME, Decreased mobility, Decreased strength, Decreased range of motion, Difficulty walking, Hypomobility, Impaired perceived functional ability, Impaired flexibility, Impaired sensation, Pain  Visit Diagnosis: Abnormality of gait and mobility  Difficulty in walking, not elsewhere classified  Muscle weakness (generalized)  Unsteadiness on feet     Problem List There are no problems to display for this patient.   Connie Burns, PT 06/25/2021, 1:44 PM  [place] MAIN [facility] SERVICES [address] [place], [place], [address] Phone: [phone]   Fax:  [phone]  Name: Connie Burns MRN: [id number] Date of Birth: 09-Dec-1935

## 2021-06-27 ENCOUNTER — Other Ambulatory Visit: Payer: Self-pay

## 2021-06-27 ENCOUNTER — Ambulatory Visit: Payer: Medicaid Other | Attending: Physician Assistant

## 2021-06-27 DIAGNOSIS — R262 Difficulty in walking, not elsewhere classified: Secondary | ICD-10-CM

## 2021-06-27 DIAGNOSIS — R269 Unspecified abnormalities of gait and mobility: Secondary | ICD-10-CM

## 2021-06-27 DIAGNOSIS — R2681 Unsteadiness on feet: Secondary | ICD-10-CM | POA: Diagnosis present

## 2021-06-27 DIAGNOSIS — M6281 Muscle weakness (generalized): Secondary | ICD-10-CM

## 2021-06-27 NOTE — Therapy (Signed)
[place] MAIN [facility] SERVICES [address] [place], [place], [address] Phone: [phone]   Fax:  [phone]  Physical Therapy Treatment/Discharge  Patient Details  Name: Connie Burns MRN: [id number] Date of Birth: 08-14-1935 Referring Provider (PT): Armstead Peaks, [place]   Encounter Date: 06/27/2021   PT End of Session - 06/27/21 0955     Visit Number 12    Number of Visits 25    Date for PT Re-Evaluation 07/31/21    Authorization Time Period 05/08/2021-07/31/2021    Progress Note Due on Visit 10    PT Start Time 0930    PT Stop Time 9983    PT Time Calculation (min) 44 min    Equipment Utilized During Treatment Gait belt    Activity Tolerance Patient tolerated treatment well    Behavior During Therapy [facility] for tasks assessed/performed             Past Medical History:  Diagnosis Date   Coronary artery disease    Hypertension    Myocardial infarction ([place])    2016   Tuberculosis    LATENT   TX 6 TO 8 MONTHS AGO    Past Surgical History:  Procedure Laterality Date   ABDOMINAL HYSTERECTOMY     CATARACT EXTRACTION W/PHACO Left 02/03/2018   Procedure: CATARACT EXTRACTION PHACO AND INTRAOCULAR LENS PLACEMENT ([place]);  Surgeon: Birder Robson, MD;  Location: [facility] ORS;  Service: Ophthalmology;  Laterality: Left;  [place] 00:36 CDE Fluid pack lot #[id number]   CATARACT EXTRACTION W/PHACO Right 03/03/2018   Procedure: CATARACT EXTRACTION PHACO AND INTRAOCULAR LENS PLACEMENT ([facility]);  Surgeon: Birder Robson, MD;  Location: [facility] ORS;  Service: Ophthalmology;  Laterality: Right;  [place]  00:44  CDE 7.04 Fluid pack lot # [id number]    There were no vitals filed for this visit.   Subjective Assessment - 06/27/21 0932     Subjective Patient is aware today is her last scheduled appointment. Reports her balance is still bad but everything else is improved. Wants today to be her last appointment.    Patient is accompained by: Interpreter    Estill Bamberg   Pertinent History Connie Burns is a 86 y.o. female past medical history of hypertension who presents for assessment approximately 2 weeks of nontraumatic pain in right leg.    Limitations Lifting;Standing;Walking;House hold activities    How long can you sit comfortably? no restrictions    How long can you stand comfortably? < 1 min    How long can you walk comfortably? <1 min    Diagnostic tests ED MD interpretation: Right lower extremity ultrasound shows no evidence of DVT, abscess, cyst or other acute process.    Patient Stated Goals I want to walk better - and be steady and less pain.    Currently in Pain? No/denies                Discharge Goals performed session prior on 06/25/21  Treatment Standing with CGA next to support surface:  Airex pad: static stand 30 seconds x 2 trials, noticeable trembling of ankles/LE's with fatigue and challenge to maintain stability Airex pad: horizontal head turns 30 seconds scanning room 10x ; cueing for arc of motion  Airex pad: vertical head turns 30 seconds, cueing for arc of motion, noticeable sway with upward gaze increasing demand on ankle righting reaction musculature Airex pad: one foot on 6" step one foot on airex pad, hold position for 30 seconds, switch legs, 2x each LE;  6 cone weaving: -weaving 4x ; knock over one cone, weakness of LLE noted -forward backward weaving 4x ; close Cga  ambulate in hallway: -horizontal head turns with cues for reading alphabet from cards 86 ftx 2 sets -horizontal head turns with cues for reading number and symbols from cards 86 ft x 2 sets   Sit to stand with airex pad under feet 10x    Pain= Denies.    SLS 30 seconds with SUE support added to HEP  Seated: GTB hamstring curl 15x each LE  GTB abduction 15x GTB around bilateral ankles;  -alternating LAQ 12x  -alternating ER/IR 12x each LE   Education provided throughout session via VC/TC and demonstration to facilitate  movement at target joints and correct muscle activation for all testing and exercises performed.     Patient wants today to be her last day. Educated on needing a new referral if she wants to return to therapy. Patient still is very unstable and would benefit from continuation of therapy due to fall risk however she declines; wishing today to be her last session. She tolerates progressive stability and strengthening interventions well with occasional LOB. We will be happy to see patient again in the future as needed.                       PT Education - 06/27/21 0955     Education Details discharge, stability interventions    Person(s) Educated Patient    Methods Explanation;Demonstration;Tactile cues;Verbal cues    Comprehension Verbalized understanding;Returned demonstration;Verbal cues required;Tactile cues required              PT Short Term Goals - 05/09/21 2110       PT SHORT TERM GOAL #1   Title Pt will be independent with HEP in order to decrease ankle pain and increase strength in order to improve pain-free function at home    Baseline Patient has no formal HEP in place    Time 6    Period Weeks    Status New    Target Date 06/20/21               PT Long Term Goals - 06/25/21 1341       PT LONG TERM GOAL #1   Title Pt will improve FOTO to target score of 45 to display perceived improvements in ability to complete ADL's    Baseline 05/08/2021=34 2/22: 39%    Time 12    Period Weeks    Status Partially Met    Target Date 07/31/21      PT LONG TERM GOAL #2   Title Pt will decrease worst pain as reported on NPRS by at least 3 points in order to demonstrate clinically significant reduction in right hip/knee/ankle/foot pain.    Baseline 05/08/2021=6/10 with mobility 2/22: 1/10; 06/25/2021= Patient denies any pain    Time 12    Period Weeks    Status Achieved    Target Date 07/31/21      PT LONG TERM GOAL #3   Title Pt will decrease 5TSTS by  at least 3 seconds in order to demonstrate clinically significant improvement in LE strength.    Baseline 05/08/2021= 47.82 sec with min UE support 2/22: 16.17 seconds without hands; 06/25/2021=11.23 sec without AD    Time 12    Period Weeks    Status Achieved    Target Date 07/31/21      PT LONG TERM GOAL #4  Title Pt will decrease TUG to below 18 seconds/decrease in order to demonstrate decreased fall risk.    Baseline 05/08/2021= 26.66 sec without an AD 2/22: 18 seconds; 06/25/2021= 12.88 sec without AD    Time 12    Period Weeks    Status Achieved    Target Date 07/31/21      PT LONG TERM GOAL #5   Title Patient will demo independent ambulation on all surfaces > 500 feet with or without AD and no LOB or rest break required for short community distances.    Baseline 05/08/2021- Patient limited to around 20 feet of ambulation- limping without an AD with poor heel strike- limited by pain. 2/22: performed with multiple near LOB in 4 minutes and 16 seconds    Time 12    Period Weeks    Status Partially Met    Target Date 07/31/21                   Plan - 06/27/21 1009     Clinical Impression Statement Patient wants today to be her last day. Educated on needing a new referral if she wants to return to therapy. Patient still is very unstable and would benefit from continuation of therapy due to fall risk however she declines; wishing today to be her last session. She tolerates progressive stability and strengthening interventions well with occasional LOB. We will be happy to see patient again in the future as needed.    Examination-Activity Limitations Bend;Caring for Others;Carry;Lift;Squat;Stairs;Stand    Examination-Participation Restrictions Church;Cleaning;Community Activity;Driving;Yard Work    [employer] officer Evolving/Moderate complexity    Rehab Potential Good    PT Frequency 2x / week    PT Duration 12 weeks    PT Treatment/Interventions ADLs/Self Care  Home Management;Cryotherapy;Moist Heat;Ultrasound;DME Instruction;Gait training;Stair training;Functional mobility training;Therapeutic activities;Therapeutic exercise;Balance training;Neuromuscular re-education;Patient/family education;Orthotic Fit/Training;Manual techniques;Compression bandaging;Passive range of motion;Dry needling;Taping;Joint Manipulations    PT Next Visit Plan Discharge next visit per patient request    PT Home Exercise Plan Reviewed standing LE Strengthening and balance HEP    Consulted and Agree with Plan of Care Patient             Patient will benefit from skilled therapeutic intervention in order to improve the following deficits and impairments:  Abnormal gait, Decreased activity tolerance, Decreased balance, Decreased endurance, Decreased knowledge of use of DME, Decreased mobility, Decreased strength, Decreased range of motion, Difficulty walking, Hypomobility, Impaired perceived functional ability, Impaired flexibility, Impaired sensation, Pain  Visit Diagnosis: Abnormality of gait and mobility  Difficulty in walking, not elsewhere classified  Muscle weakness (generalized)  Unsteadiness on feet     Problem List There are no problems to display for this patient.  Janna Arch, PT, DPT  06/27/2021, 11:33 AM  [place] MAIN [facility] SERVICES [address] [place], [place], [address] Phone: [phone]   Fax:  [phone]  Name: Connie Burns MRN: [id number] Date of Birth: [birth year]

## 2021-07-02 ENCOUNTER — Ambulatory Visit: Payer: Medicaid Other

## 2021-07-04 ENCOUNTER — Ambulatory Visit: Payer: Medicaid Other

## 2021-07-09 ENCOUNTER — Ambulatory Visit: Payer: Medicaid Other

## 2021-07-11 ENCOUNTER — Ambulatory Visit: Payer: Medicaid Other

## 2021-07-16 ENCOUNTER — Ambulatory Visit: Payer: Medicaid Other

## 2021-07-18 ENCOUNTER — Ambulatory Visit: Payer: Medicaid Other

## 2022-03-18 ENCOUNTER — Other Ambulatory Visit: Payer: Self-pay | Admitting: Nurse Practitioner

## 2022-03-18 DIAGNOSIS — Z1231 Encounter for screening mammogram for malignant neoplasm of breast: Secondary | ICD-10-CM

## 2022-05-03 ENCOUNTER — Ambulatory Visit
Admission: RE | Admit: 2022-05-03 | Discharge: 2022-05-03 | Disposition: A | Discharge status: Home / Self Care | Payer: Medicaid Other | Source: Ambulatory Visit | Attending: Nurse Practitioner | Admitting: Nurse Practitioner

## 2022-05-03 DIAGNOSIS — Z1231 Encounter for screening mammogram for malignant neoplasm of breast: Secondary | ICD-10-CM | POA: Diagnosis not present

## 2022-06-20 IMAGING — US US EXTREM LOW VENOUS*R*
1 series · 14 of 24 positions shown · non-contrast
Comparison: None.

CLINICAL DATA: Right leg pain

EXAM:
RIGHT LOWER EXTREMITY VENOUS DOPPLER ULTRASOUND
TECHNIQUE: Gray-scale sonography with compression, as well as color and duplex
ultrasound, were performed to evaluate the deep venous system(s)
from the level of the common femoral vein through the popliteal and
proximal calf veins.

[Series 1: us venous img lower uni right (dvt) · portal-venous · 14 of 51 slices shown]
[im 1/51]
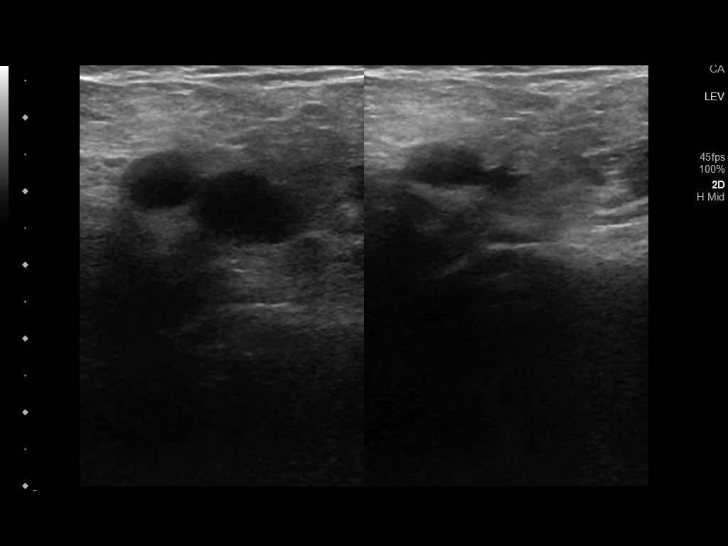
[im 5/51]
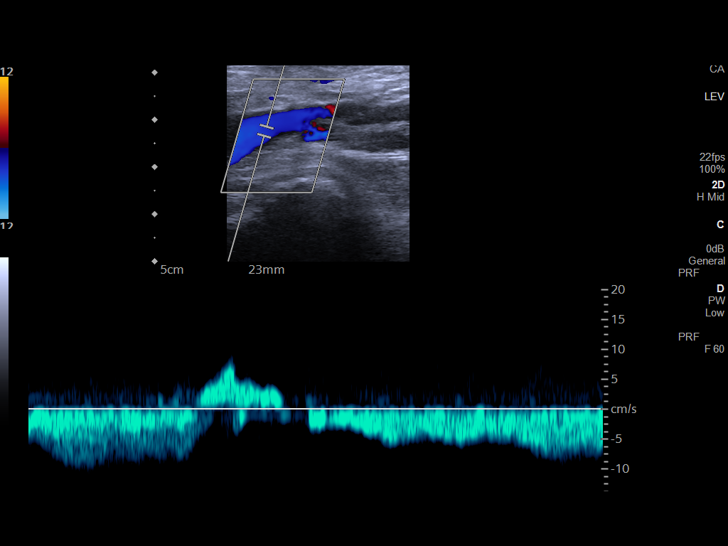
[im 9/51]
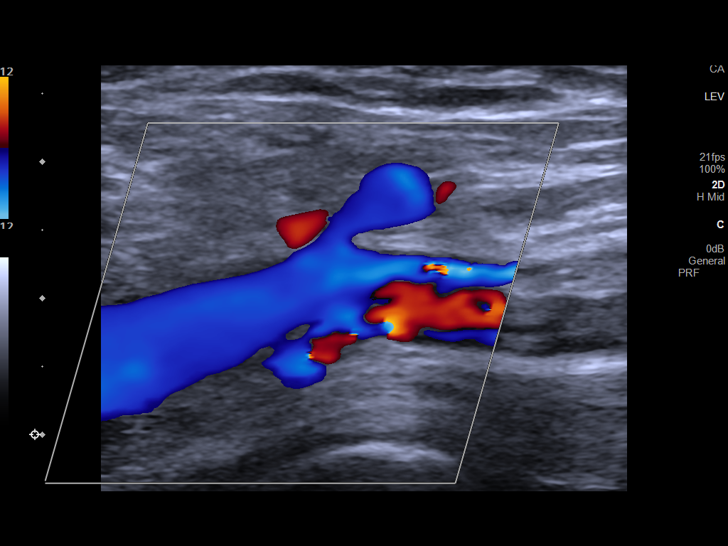
[im 14/51]
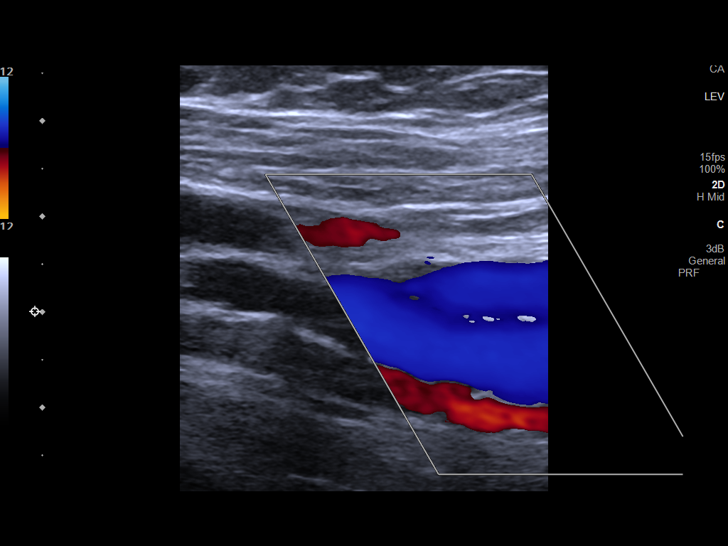
[im 16/51]
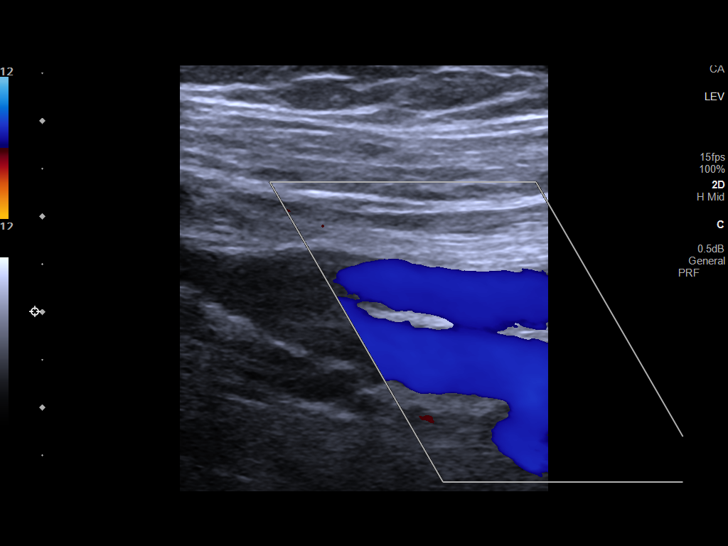
[im 20/51]
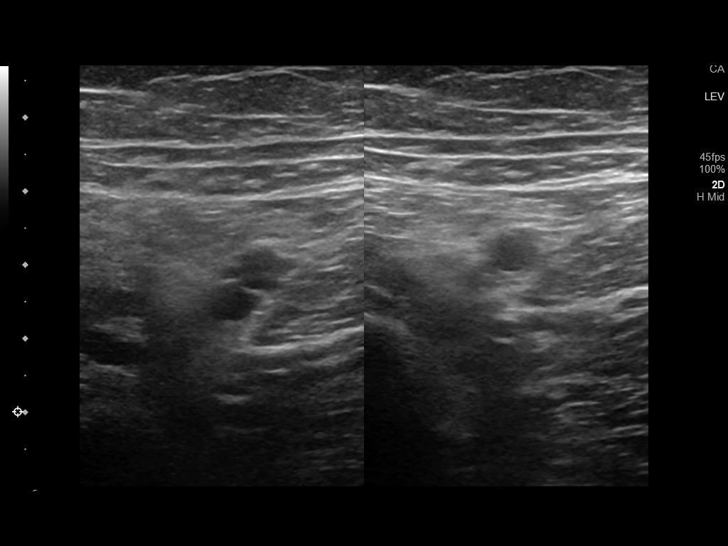
[im 24/51]
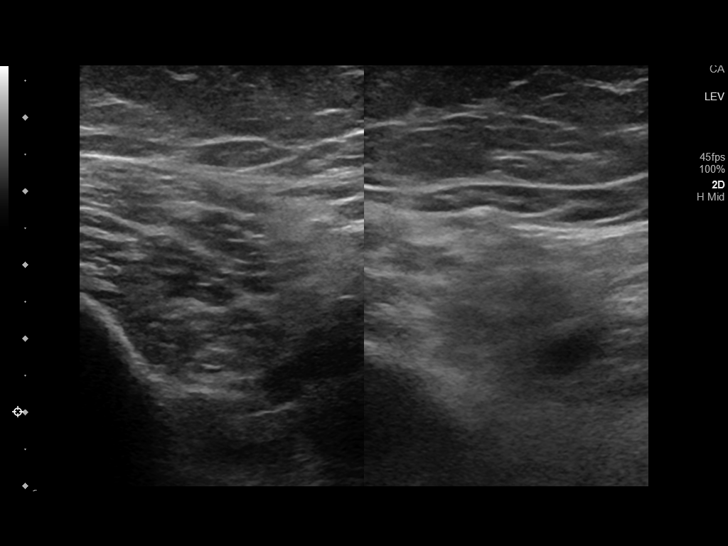
[im 27/51]
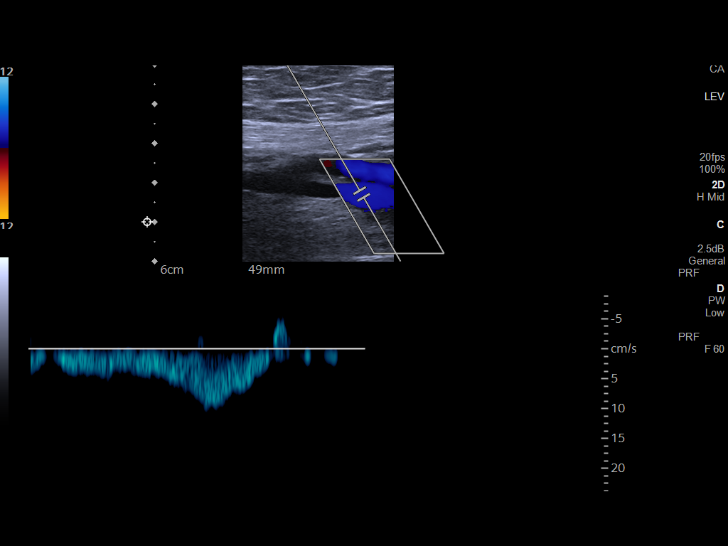
[im 31/51]
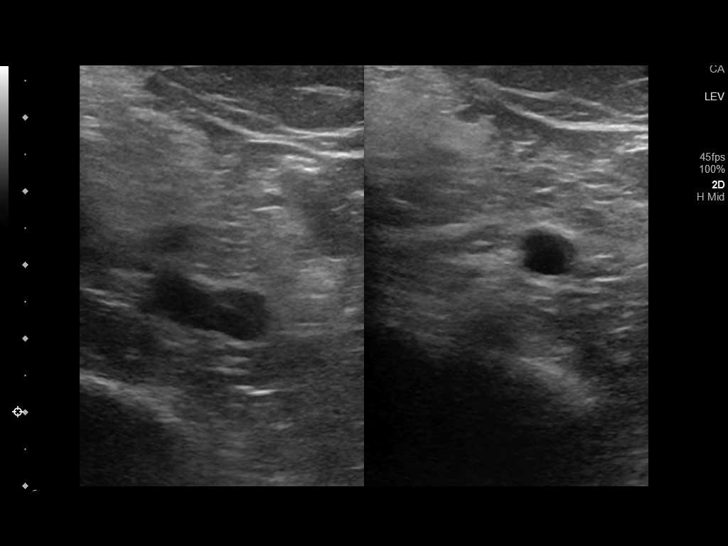
[im 35/51]
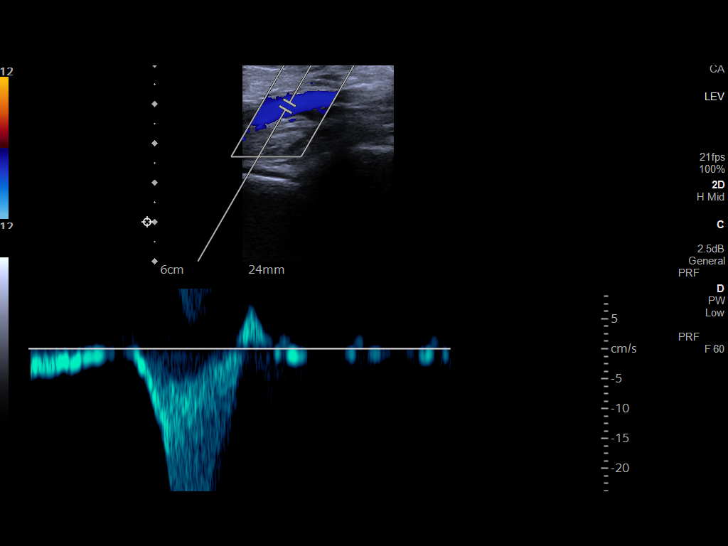
[im 40/51]
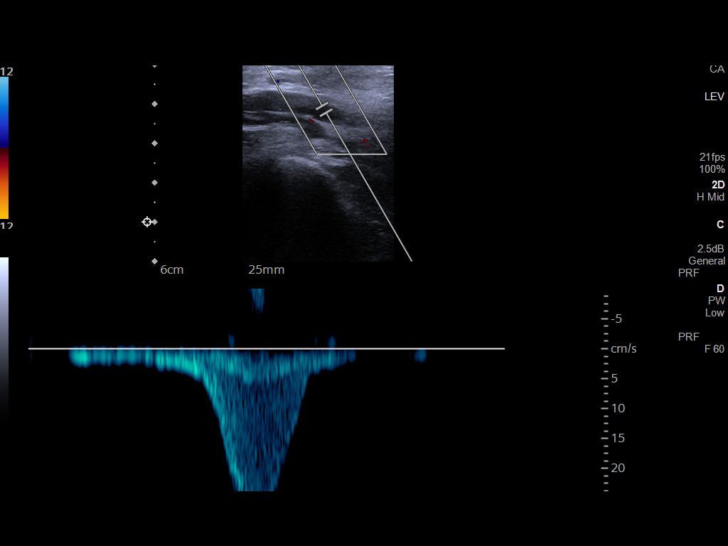
[im 42/51]
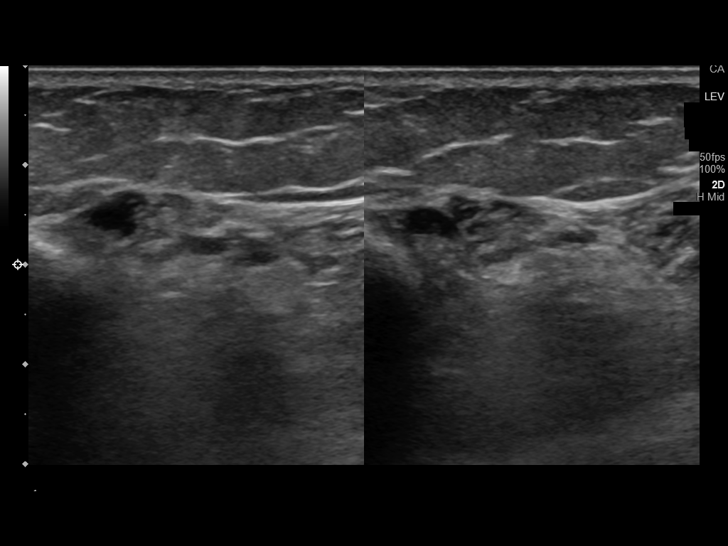
[im 46/51]
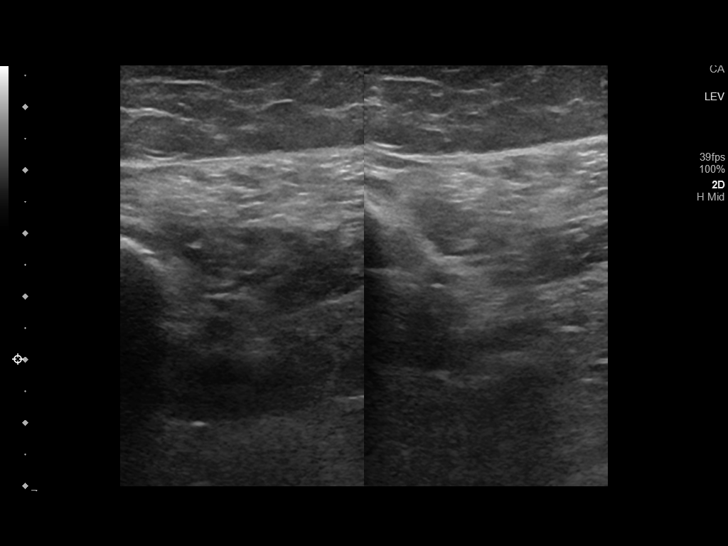
[im 51/51]
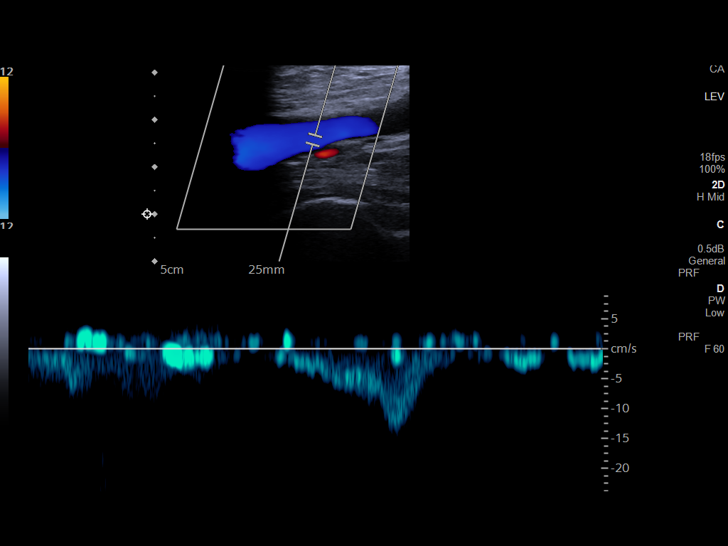

[14 of 24 positions shown; findings below may reference images not displayed]

FINDINGS: VENOUS

Normal compressibility of the common femoral, superficial femoral,
and popliteal veins, as well as the visualized calf veins.
Visualized portions of profunda femoral vein and great saphenous
vein unremarkable. No filling defects to suggest DVT on grayscale or
color Doppler imaging. Doppler waveforms show normal direction of
venous flow, normal respiratory plasticity and response to
augmentation.

Limited views of the contralateral common femoral vein are
unremarkable.

OTHER

None.

Limitations: none
IMPRESSION: Negative.

## 2022-08-02 IMAGING — CR DG LUMBAR SPINE COMPLETE 4+V
1 series · 5 of 5 positions shown · non-contrast
Comparison: None.

CLINICAL DATA: Postural instability. Low back pain. Concern for
fragility fracture.

EXAM:
LUMBAR SPINE - COMPLETE 4+ VIEW

[Series 1: dg lumbar spine complete 4 +v · 0.14mm/px · 5 of 5 slices shown]
[im 1/5]
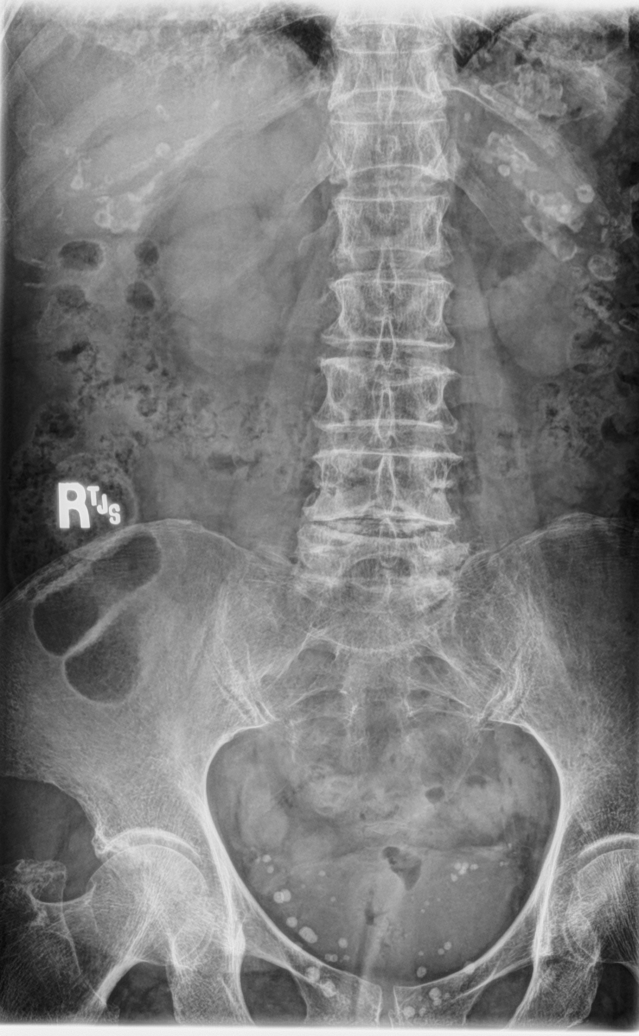
[im 2/5]
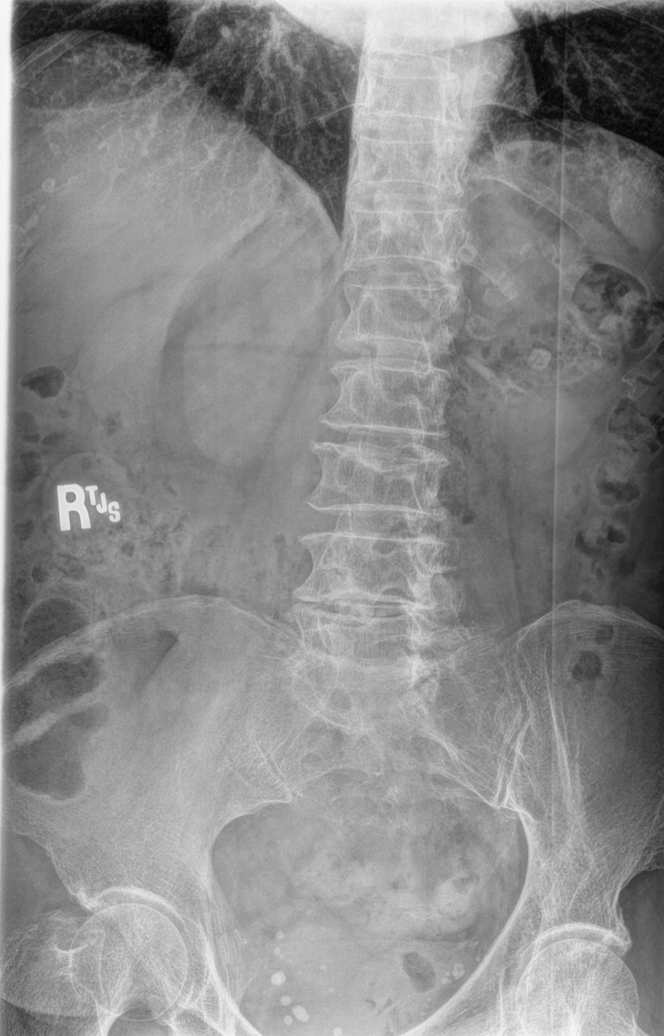
[im 3/5]
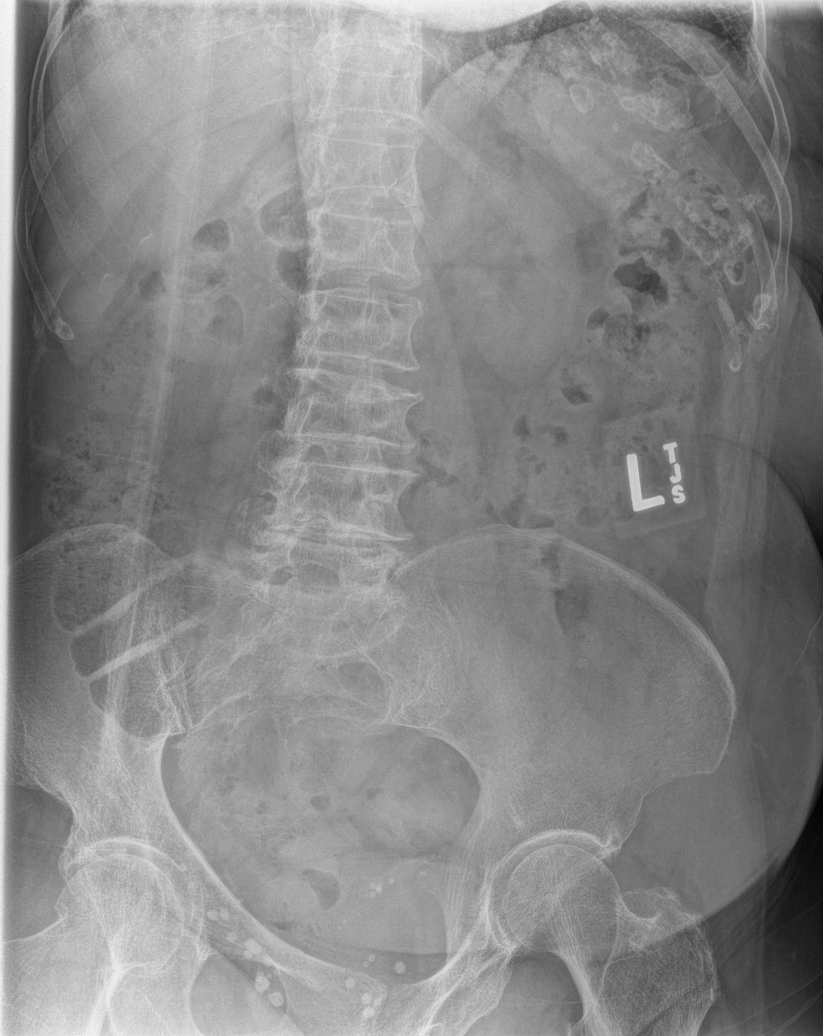
[im 4/5]
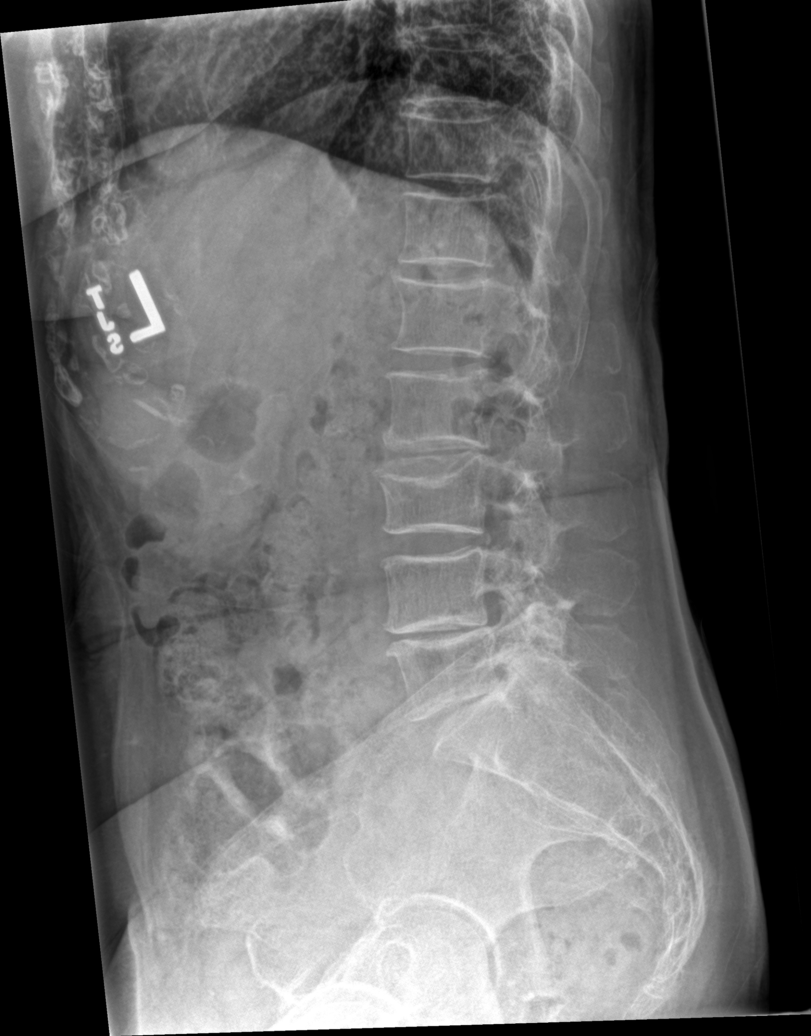
[im 5/5]
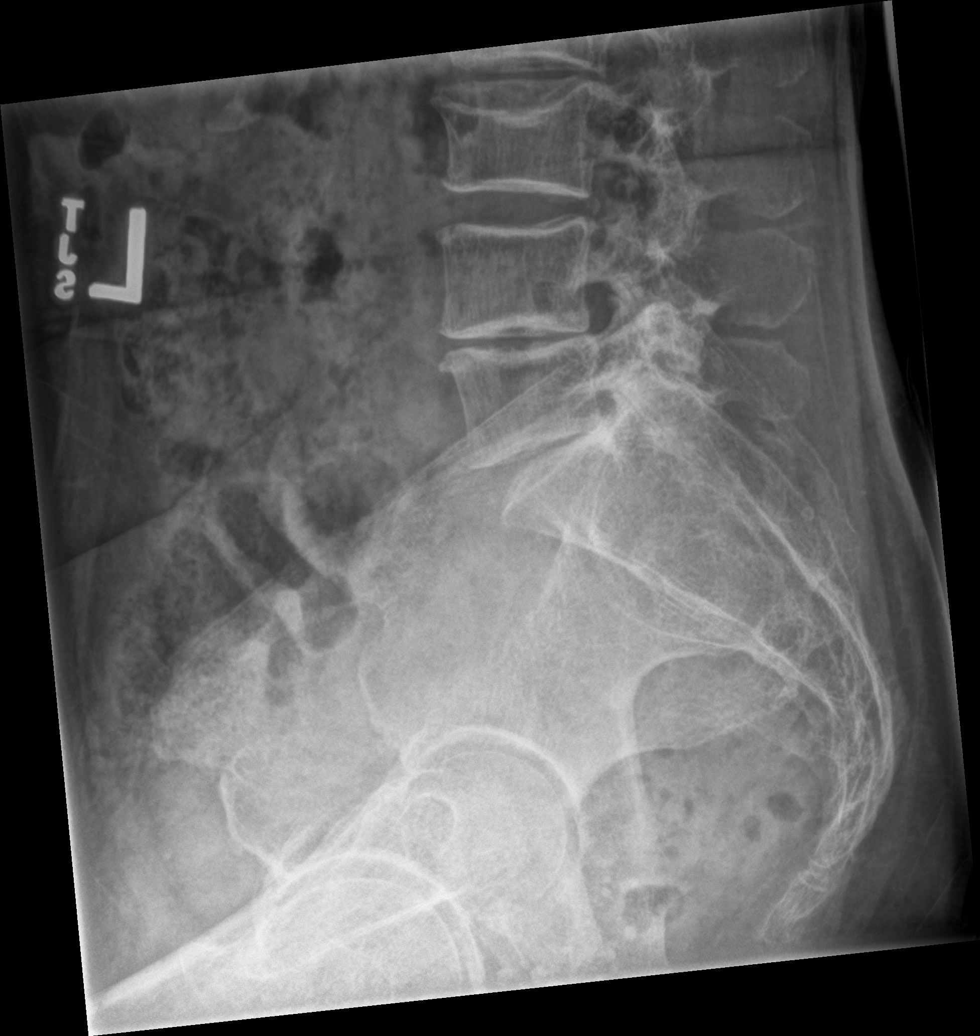

[5 of 5 positions shown; findings below may reference images not displayed]

FINDINGS: There is a compression fracture of L3 with concavity of the superior
endplate. This is an age-indeterminate finding. There is
approximately 30% loss of height of the L3 vertebral body centrally.
No other fractures. Multilevel degenerative disc disease with small
anterior osteophytes. Lower lumbar facet degenerative changes. No
other abnormalities.
IMPRESSION: Age indeterminate compression fracture of L3 with concavity of the
superior endplate and approximately 30% loss of height centrally. An
MRI could better assess acuity if clinically warranted.

Multilevel degenerative changes as above.

## 2022-11-15 ENCOUNTER — Other Ambulatory Visit: Payer: Self-pay | Admitting: Family Medicine

## 2022-11-15 ENCOUNTER — Ambulatory Visit
Admission: RE | Admit: 2022-11-15 | Discharge: 2022-11-15 | Disposition: A | Discharge status: Home / Self Care | Payer: Medicaid Other | Source: Home / Self Care | Attending: Family Medicine | Admitting: Family Medicine

## 2022-11-15 ENCOUNTER — Ambulatory Visit
Admission: RE | Admit: 2022-11-15 | Discharge: 2022-11-15 | Disposition: A | Discharge status: Home / Self Care | Payer: Medicaid Other | Source: Ambulatory Visit | Attending: Family Medicine | Admitting: Family Medicine

## 2022-11-15 DIAGNOSIS — M549 Dorsalgia, unspecified: Secondary | ICD-10-CM | POA: Diagnosis present

## 2022-11-15 DIAGNOSIS — R52 Pain, unspecified: Secondary | ICD-10-CM | POA: Insufficient documentation

## 2023-04-06 ENCOUNTER — Encounter: Payer: Self-pay | Admitting: Emergency Medicine

## 2023-04-06 DIAGNOSIS — R1031 Right lower quadrant pain: Secondary | ICD-10-CM | POA: Insufficient documentation

## 2023-04-06 LAB — LIPASE, BLOOD: Lipase: 33 U/L (ref 11–51)

## 2023-04-06 LAB — URINALYSIS, MICROSCOPIC (REFLEX): Bacteria, UA: NONE SEEN

## 2023-04-06 LAB — COMPREHENSIVE METABOLIC PANEL
ALT: 17 U/L (ref 0–44)
AST: 23 U/L (ref 15–41)
Albumin: 4.3 g/dL (ref 3.5–5.0)
Alkaline Phosphatase: 80 U/L (ref 38–126)
Anion gap: 10 (ref 5–15)
BUN: 24 mg/dL — ABNORMAL HIGH (ref 8–23)
CO2: 24 mmol/L (ref 22–32)
Calcium: 9.1 mg/dL (ref 8.9–10.3)
Chloride: 100 mmol/L (ref 98–111)
Creatinine, Ser: 0.94 mg/dL (ref 0.44–1.00)
GFR, Estimated: 59 mL/min — ABNORMAL LOW (ref >60)
Glucose, Bld: 111 mg/dL — ABNORMAL HIGH (ref 70–99)
Potassium: 4.1 mmol/L (ref 3.5–5.1)
Sodium: 134 mmol/L — ABNORMAL LOW (ref 135–145)
Total Bilirubin: 0.6 mg/dL (ref <1.2)
Total Protein: 8 g/dL (ref 6.5–8.1)

## 2023-04-06 LAB — CBC
HCT: 43.4 % (ref 36.0–46.0)
Hemoglobin: 14.5 g/dL (ref 12.0–15.0)
MCH: 30.7 pg (ref 26.0–34.0)
MCHC: 33.4 g/dL (ref 30.0–36.0)
MCV: 91.8 fL (ref 80.0–100.0)
Platelets: 322 10*3/uL (ref 150–400)
RBC: 4.73 MIL/uL (ref 3.87–5.11)
RDW: 13.2 % (ref 11.5–15.5)
WBC: 10.1 10*3/uL (ref 4.0–10.5)
nRBC: 0 % (ref 0.0–0.2)

## 2023-04-06 LAB — URINALYSIS, ROUTINE W REFLEX MICROSCOPIC
Bilirubin Urine: NEGATIVE
Glucose, UA: NEGATIVE mg/dL
Hgb urine dipstick: NEGATIVE
Ketones, ur: NEGATIVE mg/dL
Nitrite: NEGATIVE
Protein, ur: NEGATIVE mg/dL
Specific Gravity, Urine: 1.015 (ref 1.005–1.030)
pH: 7 (ref 5.0–8.0)

## 2023-04-06 NOTE — ED Triage Notes (Signed)
Pt here from home with c/o right lower abd pain has been ongoing for 2 days ,along with some slight nausea

## 2023-04-07 ENCOUNTER — Emergency Department
Admission: EM | Admit: 2023-04-07 | Discharge: 2023-04-07 | Disposition: A | Discharge status: Home / Self Care | Payer: Medicaid Other | Attending: Emergency Medicine | Admitting: Emergency Medicine

## 2023-04-07 ENCOUNTER — Emergency Department: Payer: Medicaid Other

## 2023-04-07 DIAGNOSIS — R1031 Right lower quadrant pain: Secondary | ICD-10-CM

## 2023-04-07 MED ORDER — MORPHINE SULFATE (PF) 2 MG/ML IV SOLN
2.0000 mg | Freq: Once | INTRAVENOUS | Status: AC
  Administered 2023-04-07: 2 mg via INTRAVENOUS
  Filled 2023-04-07: qty 1

## 2023-04-07 MED ORDER — IOHEXOL 300 MG/ML  SOLN
100.0000 mL | Freq: Once | INTRAMUSCULAR | Status: AC | PRN
  Administered 2023-04-07: 100 mL via INTRAVENOUS

## 2023-04-07 MED ORDER — KETOROLAC TROMETHAMINE 30 MG/ML IJ SOLN
15.0000 mg | Freq: Once | INTRAMUSCULAR | Status: AC
  Administered 2023-04-07: 15 mg via INTRAVENOUS
  Filled 2023-04-07: qty 1

## 2023-04-07 MED ORDER — ONDANSETRON HCL 4 MG/2ML IJ SOLN
4.0000 mg | INTRAMUSCULAR | Status: AC
  Administered 2023-04-07: 4 mg via INTRAVENOUS
  Filled 2023-04-07: qty 2

## 2023-04-07 NOTE — ED Notes (Signed)
Patient assisted to use bathroom. Steady gait noted ?

## 2023-04-07 NOTE — ED Provider Notes (Signed)
[facility] Provider Note    Event Date/Time   First MD Initiated Contact with Patient 04/07/23 0140     (approximate)   History   Abdominal Pain  The patient and/or family speak(s) Spanish.  They understand they have the right to the use of a hospital interpreter, however at this time they prefer to speak directly with me in Spanish.  They know that they can ask for an interpreter at any time.   HPI Connie Burns is a 87 y.o. female who is generally healthy and has had a hysterectomy but no other prior surgeries.  She presents for evaluation of 2 to 3 days of right lower quadrant abdominal pain.  It feels like it is stabbing and pulsating.  She has had decreased appetite and nausea but no vomiting.  No pain when she urinates and no increased urinary frequency.  No similar symptoms in the past.  Pain seems to be getting worse over the last few days.  She denies fever, chest pain, shortness of breath.     Physical Exam   Triage Vital Signs: ED Triage Vitals  Encounter Vitals Group     BP 04/06/23 2211 (!) 143/106     Systolic BP Percentile --      Diastolic BP Percentile --      Pulse Rate 04/06/23 2211 92     Resp 04/06/23 2211 20     Temp 04/06/23 2211 98.5 F (36.9 C)     Temp src --      SpO2 04/06/23 2211 96 %     Weight 04/06/23 2211 54.4 kg (120 lb)     Height 04/06/23 2211 1.524 m (5')     Head Circumference --      Peak Flow --      Pain Score 04/06/23 2210 7     Pain Loc --      Pain Education --      Exclude from Growth Chart --     Most recent vital signs: Vitals:   04/07/23 0400 04/07/23 0430  BP: (!) 145/86 (!) 141/74  Pulse: 72 70  Resp:  18  Temp:    SpO2: 97% 96%    General: Awake, generally well-appearing for age. CV:  Good peripheral perfusion.  Regular rate and rhythm. Resp:  Normal effort. Speaking easily and comfortably, no accessory muscle usage nor intercostal retractions.   Abd:  No distention.  Point  tenderness to palpation in the right lower quadrant with some guarding, otherwise no rebound and no tenderness of the abdomen.   ED Results / Procedures / Treatments   Labs (all labs ordered are listed, but only abnormal results are displayed) Labs Reviewed  COMPREHENSIVE METABOLIC PANEL - Abnormal; Notable for the following components:      Result Value   Sodium 134 (*)    Glucose, Bld 111 (*)    BUN 24 (*)    GFR, Estimated 59 (*)    All other components within normal limits  URINALYSIS, ROUTINE W REFLEX MICROSCOPIC - Abnormal; Notable for the following components:   Leukocytes,Ua SMALL (*)    All other components within normal limits  LIPASE, BLOOD  CBC  URINALYSIS, MICROSCOPIC (REFLEX)      RADIOLOGY I viewed and interpreted the patient's CT of the abdomen and pelvis; see hospital course for details.   PROCEDURES:  Critical Care performed: No  Procedures    IMPRESSION / MDM / ASSESSMENT AND PLAN / ED COURSE  I reviewed the triage vital signs and the nursing notes.                              Differential diagnosis includes, but is not limited to, appendicitis, diverticulitis, ovarian cyst (patient had a hysterectomy but it is unclear if she had oophorectomy), UTI/pyelonephritis, renal/ureteral colic.  Patient's presentation is most consistent with acute presentation with potential threat to life or bodily function.  Labs/studies ordered: CMP, CBC, lipase, urinalysis, CT of the abdomen and pelvis  Interventions/Medications given:  Medications  ketorolac (TORADOL) 30 MG/ML injection 15 mg (15 mg Intravenous Given 04/07/23 0311)  ondansetron (ZOFRAN) injection 4 mg (4 mg Intravenous Given 04/07/23 0311)  morphine (PF) 2 MG/ML injection 2 mg (2 mg Intravenous Given 04/07/23 0312)  iohexol (OMNIPAQUE) 300 MG/ML solution 100 mL (100 mLs Intravenous Contrast Given 04/07/23 0251)    (Note:  hospital course my include additional interventions and/or labs/studies not  listed above.)   Point tenderness, essentially localized peritonitis, of the right lower quadrant.  Vital signs stable but history of present illness is suggestive of appendicitis.  Labs are all essentially normal including no leukocytosis.  No evidence of UTI on urinalysis (only small leukocytes, otherwise negative and asymptomatic).  Morphine 2 mg IV, Toradol 15 mg IV, Zofran 4 mg IV.  Patient and family agrees with plan for CT scan and reassessment.   Clinical Course as of 04/07/23 0506  Mon Apr 07, 2023  0500 CT ABDOMEN PELVIS W CONTRAST I viewed and interpreted the patient's CT of the abdomen and pelvis and there is no evidence of appendicitis or diverticulitis.  I updated and reassessed the patient and she feels much better than she did before and is ready to go home.  I recommended close outpatient follow-up and gave my usual and customary return precautions. [CF]    Clinical Course User Index [CF] Loleta Rose, MD     FINAL CLINICAL IMPRESSION(S) / ED DIAGNOSES   Final diagnoses:  RLQ abdominal pain     Rx / DC Orders   ED Discharge Orders     None        Note:  This document was prepared using Dragon voice recognition software and may include unintentional dictation errors.   Loleta Rose, MD 04/07/23 [phone]

## 2023-08-08 ENCOUNTER — Other Ambulatory Visit: Payer: Self-pay | Admitting: Nurse Practitioner

## 2023-08-08 DIAGNOSIS — Z780 Asymptomatic menopausal state: Secondary | ICD-10-CM

## 2024-02-13 ENCOUNTER — Other Ambulatory Visit: Payer: Self-pay

## 2024-02-13 MED ORDER — FLUZONE HIGH-DOSE 0.5 ML IM SUSY
0.5000 mL | PREFILLED_SYRINGE | Freq: Once | INTRAMUSCULAR | 0 refills | Status: AC
  Filled 2024-02-13: qty 0.5, 1d supply, fill #0
